# Patient Record
Sex: Male | Born: 1965 | Race: White | Hispanic: No | Marital: Married | Smoking: Current every day smoker
Health system: Southern US, Community
[De-identification: ages and names within clinical notes are randomized; demographics above are authoritative.]

## PROBLEM LIST (undated history)

## (undated) DIAGNOSIS — G4730 Sleep apnea, unspecified: Secondary | ICD-10-CM

## (undated) DIAGNOSIS — T8859XA Other complications of anesthesia, initial encounter: Secondary | ICD-10-CM

## (undated) DIAGNOSIS — M1990 Unspecified osteoarthritis, unspecified site: Secondary | ICD-10-CM

## (undated) DIAGNOSIS — I82609 Acute embolism and thrombosis of unspecified veins of unspecified upper extremity: Secondary | ICD-10-CM

## (undated) DIAGNOSIS — I2510 Atherosclerotic heart disease of native coronary artery without angina pectoris: Secondary | ICD-10-CM

## (undated) DIAGNOSIS — K429 Umbilical hernia without obstruction or gangrene: Secondary | ICD-10-CM

## (undated) DIAGNOSIS — I809 Phlebitis and thrombophlebitis of unspecified site: Secondary | ICD-10-CM

## (undated) DIAGNOSIS — F4310 Post-traumatic stress disorder, unspecified: Secondary | ICD-10-CM

## (undated) DIAGNOSIS — K760 Fatty (change of) liver, not elsewhere classified: Secondary | ICD-10-CM

## (undated) DIAGNOSIS — K219 Gastro-esophageal reflux disease without esophagitis: Secondary | ICD-10-CM

## (undated) HISTORY — DX: Unspecified osteoarthritis, unspecified site: M19.90

## (undated) HISTORY — PX: TONSILLECTOMY: SUR1361

## (undated) HISTORY — DX: Phlebitis and thrombophlebitis of unspecified site: I80.9

## (undated) HISTORY — DX: Sleep apnea, unspecified: G47.30

## (undated) HISTORY — DX: Gastro-esophageal reflux disease without esophagitis: K21.9

## (undated) HISTORY — PX: NASAL SINUS SURGERY: SHX719

## (undated) HISTORY — PX: UPPER GI ENDOSCOPY: SHX6162

## (undated) HISTORY — DX: Umbilical hernia without obstruction or gangrene: K42.9

---

## 2003-05-11 HISTORY — PX: CERVICAL DISCECTOMY: SHX98

## 2004-07-05 ENCOUNTER — Ambulatory Visit (HOSPITAL_BASED_OUTPATIENT_CLINIC_OR_DEPARTMENT_OTHER): Admission: RE | Admit: 2004-07-05 | Discharge: 2004-07-05 | Payer: Self-pay | Admitting: Otolaryngology

## 2004-07-05 ENCOUNTER — Ambulatory Visit: Payer: Self-pay | Admitting: Internal Medicine

## 2004-08-10 ENCOUNTER — Ambulatory Visit (HOSPITAL_COMMUNITY): Admission: RE | Admit: 2004-08-10 | Discharge: 2004-08-11 | Payer: Self-pay | Admitting: Otolaryngology

## 2004-08-10 ENCOUNTER — Encounter (INDEPENDENT_AMBULATORY_CARE_PROVIDER_SITE_OTHER): Payer: Self-pay | Admitting: Specialist

## 2004-11-27 ENCOUNTER — Ambulatory Visit (HOSPITAL_COMMUNITY): Admission: RE | Admit: 2004-11-27 | Discharge: 2004-11-27 | Payer: Self-pay | Admitting: Family Medicine

## 2005-02-26 ENCOUNTER — Ambulatory Visit (HOSPITAL_COMMUNITY): Admission: RE | Admit: 2005-02-26 | Discharge: 2005-02-26 | Payer: Self-pay | Admitting: Neurosurgery

## 2005-04-20 ENCOUNTER — Encounter: Admission: RE | Admit: 2005-04-20 | Discharge: 2005-04-20 | Payer: Self-pay | Admitting: Neurosurgery

## 2010-05-22 ENCOUNTER — Ambulatory Visit
Admission: RE | Admit: 2010-05-22 | Discharge: 2010-05-22 | Payer: Self-pay | Source: Home / Self Care | Attending: Family Medicine | Admitting: Family Medicine

## 2010-05-22 DIAGNOSIS — M129 Arthropathy, unspecified: Secondary | ICD-10-CM | POA: Insufficient documentation

## 2010-05-22 DIAGNOSIS — Z9189 Other specified personal risk factors, not elsewhere classified: Secondary | ICD-10-CM | POA: Insufficient documentation

## 2010-05-22 DIAGNOSIS — F1011 Alcohol abuse, in remission: Secondary | ICD-10-CM | POA: Insufficient documentation

## 2010-05-22 DIAGNOSIS — M722 Plantar fascial fibromatosis: Secondary | ICD-10-CM | POA: Insufficient documentation

## 2010-05-22 DIAGNOSIS — G4733 Obstructive sleep apnea (adult) (pediatric): Secondary | ICD-10-CM | POA: Insufficient documentation

## 2010-05-22 DIAGNOSIS — R1013 Epigastric pain: Secondary | ICD-10-CM | POA: Insufficient documentation

## 2010-05-28 ENCOUNTER — Encounter: Payer: Self-pay | Admitting: Family Medicine

## 2010-05-28 ENCOUNTER — Ambulatory Visit
Admission: RE | Admit: 2010-05-28 | Discharge: 2010-05-28 | Payer: Self-pay | Source: Home / Self Care | Attending: Family Medicine | Admitting: Family Medicine

## 2010-05-28 DIAGNOSIS — E786 Lipoprotein deficiency: Secondary | ICD-10-CM | POA: Insufficient documentation

## 2010-05-29 DIAGNOSIS — R7309 Other abnormal glucose: Secondary | ICD-10-CM | POA: Insufficient documentation

## 2010-05-29 LAB — CONVERTED CEMR LAB
ALT: 31 units/L (ref 0–53)
AST: 17 units/L (ref 0–37)
Albumin: 4.7 g/dL (ref 3.5–5.2)
Alkaline Phosphatase: 57 units/L (ref 39–117)
BUN: 7 mg/dL (ref 6–23)
CO2: 28 meq/L (ref 19–32)
Calcium: 9.2 mg/dL (ref 8.4–10.5)
Chloride: 101 meq/L (ref 96–112)
Cholesterol: 123 mg/dL (ref 0–200)
Creatinine, Ser: 1.05 mg/dL (ref 0.40–1.50)
Glucose, Bld: 118 mg/dL — ABNORMAL HIGH (ref 70–99)
HDL: 24 mg/dL — ABNORMAL LOW (ref >39)
LDL Cholesterol: 70 mg/dL (ref 0–99)
Lipase: 13 units/L (ref 0–75)
Potassium: 4.3 meq/L (ref 3.5–5.3)
Sodium: 140 meq/L (ref 135–145)
TSH: 1.533 microintl units/mL (ref 0.350–4.500)
Total Bilirubin: 0.7 mg/dL (ref 0.3–1.2)
Total CHOL/HDL Ratio: 5.1
Total Protein: 6.5 g/dL (ref 6.0–8.3)
Triglycerides: 144 mg/dL (ref <150)
VLDL: 29 mg/dL (ref 0–40)

## 2010-05-30 ENCOUNTER — Encounter: Payer: Self-pay | Admitting: Family Medicine

## 2010-06-03 ENCOUNTER — Encounter: Payer: Self-pay | Admitting: Internal Medicine

## 2010-06-03 ENCOUNTER — Ambulatory Visit
Admission: RE | Admit: 2010-06-03 | Discharge: 2010-06-03 | Payer: Self-pay | Source: Home / Self Care | Attending: Internal Medicine | Admitting: Internal Medicine

## 2010-06-05 ENCOUNTER — Other Ambulatory Visit: Payer: Self-pay | Admitting: Internal Medicine

## 2010-06-05 ENCOUNTER — Ambulatory Visit
Admission: RE | Admit: 2010-06-05 | Discharge: 2010-06-05 | Payer: Self-pay | Source: Home / Self Care | Attending: Internal Medicine | Admitting: Internal Medicine

## 2010-06-05 ENCOUNTER — Telehealth: Payer: Self-pay | Admitting: Internal Medicine

## 2010-06-05 DIAGNOSIS — K2270 Barrett's esophagus without dysplasia: Secondary | ICD-10-CM

## 2010-06-08 ENCOUNTER — Telehealth: Payer: Self-pay | Admitting: Internal Medicine

## 2010-06-10 ENCOUNTER — Encounter: Payer: Self-pay | Admitting: Internal Medicine

## 2010-06-11 NOTE — Progress Notes (Signed)
Summary: ? re meds  Phone Note Call from Patient Call back at Home Phone [phone] Call back at Work Phone [phone] Call back at cell [phone]   Caller: SpouseJennifer Call For: Dr Juanda Chance Reason for Call: Talk to Nurse Summary of Call: Patient has questions regarding medications given to him today after procedure. Initial call taken by: Tawni Levy,  June 05, 2010 4:20 PM  Follow-up for Phone Call        Spoke with wife and explained how to take all of the meds.   She wrote them all down again "in her own words", and she said that she would try to get  "him" the patient to take them.  She also has the nexium card to help with expenses.  I spoke with this couple extensively in recovery.  Wife understands the schedule now, and will call if there are any more questions. Follow-up by: Clide Cliff RN,  June 05, 2010 5:26 PM

## 2010-06-11 NOTE — Procedures (Addendum)
Summary: Upper Endoscopy  Patient: Kenneth Mckee Note: All result statuses are Final unless otherwise noted.  Tests: (1) Upper Endoscopy (EGD)   EGD Upper Endoscopy       DONE     [facility]     [address]     [place], [place]  [address]           ENDOSCOPY PROCEDURE REPORT           PATIENT:  [name], [name]  MR#:  [id number]     BIRTHDATE:  04-22-1966, 44 yrs. old  GENDER:  male           ENDOSCOPIST:  Hedwig Morton. Juanda Chance, MD     Referred by:  Excell Seltzer, M.D.           PROCEDURE DATE:  06/05/2010     PROCEDURE:  EGD with biopsy, [id number]     ASA CLASS:  Class II     INDICATIONS:  abdominal pain several months of epig.pain, while     taking Motrin 800mg , 2-3x/day, refractory to prelosec, improved on     nexiem           MEDICATIONS:   Versed 5 mg, Fentanyl 50 mcg     TOPICAL ANESTHETIC:  Exactacain Spray           DESCRIPTION OF PROCEDURE:   After the risks benefits and     alternatives of the procedure were thoroughly explained, informed     consent was obtained.  The LB GIF-H180 [id number] endoscope was     introduced through the mouth and advanced to the second portion of     the duodenum, without limitations.  The instrument was slowly     withdrawn as the mucosa was fully examined.     <<PROCEDUREIMAGES>>           ULTRASONIC FINDINGS:  Esophagitis was found. long tongue of     gastric mucosa.  cm, With standard forceps, a biopsy was obtained     and sent to pathology (see image1, image9, image8, and image7).     r/o barrett's A hiatal hernia was found (see image2). 2 cm     redjucible hiatal hernia  Retained food was present (see image3).     100cc of retained fluid, no food  Otherwise the examination was     normal. With standard forceps, a biopsy was obtained and sent to     pathology. biopsy gastric antrum to r/o H (see image4, image5, and     image6).pylori    Retroflexed views revealed no abnormalities.     The scope was then withdrawn from the patient and  the procedure     completed.           COMPLICATIONS:  None           ENDOSCOPIC IMPRESSION:     1) Esophagitis     2) Hiatal hernia     3) Food, retained     4) Otherwise normal examination     RECOMMENDATIONS:     1) Await biopsy results     2) Anti-reflux regimen to be follow     Nexiem 40 mg po bid if financially possible, otherwise Prelosec     40mg  po bid #60,and Pepcid 40 mg qd, # 30 1 po qd x 1 year refill                 REPEAT EXAM:  In 0 year(s)  for.           ______________________________     Hedwig Morton. Juanda Chance, MD           CC:           n.     eSIGNED:   Hedwig Morton. Brodie at 06/05/2010 08:31 AM           Page 2 of 3   Koury, Roddy Ellsworth, [id number]  Note: An exclamation mark (!) indicates a result that was not dispersed into the flowsheet. Document Creation Date: 06/05/2010 8:32 AM _______________________________________________________________________  (1) Order result status: Final Collection or observation date-time: 06/05/2010 08:18 Requested date-time:  Receipt date-time:  Reported date-time:  Referring Physician:   Ordering Physician: Lina Sar [id number]) Specimen Source:  Source: Launa Grill Order Number: [id number] Lab site:   Appended Document: Upper Endoscopy     Procedures Next Due Date:    EGD: 06/2012

## 2010-06-11 NOTE — Miscellaneous (Signed)
Summary: Med  Clinical Lists Changes  Medications: Added new medication of DICLOFENAC SODIUM 75 MG TBEC (DICLOFENAC SODIUM) one tablet twice  daily - Signed Rx of DICLOFENAC SODIUM 75 MG TBEC (DICLOFENAC SODIUM) one tablet twice  daily;  #60 x 0;  Signed;  Entered by: Clide Cliff RN;  Authorized by: Hart Carwin MD;  Method used: [facility]  [address] [phone]*, [address], [place], [place], [place]  [address], Ph: [phone] or [phone], Fax: [phone]    Prescriptions: DICLOFENAC SODIUM 75 MG TBEC (DICLOFENAC SODIUM) one tablet twice  daily  #60 x 0   Entered by:   Clide Cliff RN   Authorized by:   Hart Carwin MD   Signed by:   Clide Cliff RN on 06/05/2010   Method used:   [facility]        [facility]  [address] [phone]* (retail)       [address]       [place], [place]  [address]       Ph: [phone] or [phone]       Fax: [phone]   RxID:   [id number]

## 2010-06-11 NOTE — Assessment & Plan Note (Signed)
Summary: NEW PT TO EST/CLE   Vital Signs:  Patient profile:   45 year old male Height:      72.5 inches Weight:      259.75 pounds BMI:     34.87 Temp:     98.7 degrees F oral Pulse rate:   76 / minute Pulse rhythm:   regular BP sitting:   120 / 70  (left arm) Cuff size:   large  Vitals Entered By: Benny Lennert CMA Duncan Dull) (May 22, 2010 2:04 PM)  History of Present Illness: Chief complaint new patient to be established   In last year.. saw MD out of network...Dr. Elwyn Reach in [facility] [place].   GERD, epiagstric pain.Marland Kitchen on nexium 6 months ago ..this mproved symtpoms. Tried to change to omeprazole but symtpoms came back.  Even now since back on nexium x serercal months.. has remained tender in epigastrum  No current heartburn, no sour taste in throat. No blood in stool.  Last CPX over 10 years ago.   Seeing Dr. Delford Field for podiatry for plantar fasciitis... 75 % improved.  Severe sleep apnea.. Had multiple ENT surgeries...nonwe helped still stops sleeping at night, still snoring loud.  AM fatigue.  No headache.   Preventive Screening-Counseling & Management  Alcohol-Tobacco     Smoking Status: current  Caffeine-Diet-Exercise     Does Patient Exercise: no      Drug Use:  no.    Problems Prior to Update: None  Current Medications (verified): 1)  Nexium 40 Mg Cpdr (Esomeprazole Magnesium) .... One Tablet Daily  Allergies (verified): 1)  ! Penicillin  Past History:  Past medical, surgical, family and social histories (including risk factors) reviewed, and no changes noted (except as noted below).  Past Medical History: Current Problems:  CHICKENPOX, HX OF (ICD-V15.9) ARTHRITIS (ICD-716.90)  Phlebitis in right wrist artery...so small  ARTERYand so far down.. no surgery indicated.. was on coumadin 6 months in 2004 Sleep study: 2004 to 2006? severe sleep apnea  Past Surgical History:  discectomy cervical 2005  Family History: Reviewed history and no  changes required. mother: healthy father: MI age 29 no siblings PGF: leukemia  Social History: Reviewed history and no changes required. 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 CV:  Denies chest pain or discomfort. Resp:  Denies shortness of breath. GI:  Complains of abdominal pain and constipation; denies bloody stools, diarrhea, gas, nausea, vomiting, and vomiting blood; straining more than he used to in past.. still regualr BMs. GU:  Denies dysuria. Psych:  Denies anxiety and depression.  Physical Exam  General:  Well-developed,well-nourished,in no acute distress; alert,appropriate and cooperative throughout examination Ears:  External ear exam shows no significant lesions or deformities.  Otoscopic examination reveals clear canals, tympanic membranes are intact bilaterally without bulging, retraction, inflammation or discharge. Hearing is grossly normal bilaterally. Nose:  External nasal examination shows no deformity or inflammation. Nasal mucosa are pink and moist without lesions or exudates. Mouth:  Oral mucosa and oropharynx without lesions or exudates.  Teeth in good repair. Neck:  no carotid bruit or thyromegaly no cervical or supraclavicular lymphadenopathy  Lungs:  Normal respiratory effort, chest expands symmetrically. Lungs are clear to auscultation, no crackles or wheezes. Heart:  Normal rate  and regular rhythm. S1 and S2 normal without gallop, murmur, click, rub or other extra sounds. Abdomen:  ttp in epigastrum, no rebound no guarding, no hepatosplenomegaly. no abdominal hernia and no inguinal hernia.   Pulses:  R and L posterior tibial pulses are  full and equal bilaterally  Extremities:  no edema  Skin:  Intact without suspicious lesions or rashes Psych:  Cognition and judgment appear intact. Alert and cooperative with normal attention span and concentration. No apparent delusions, illusions, hallucinations   Impression & Recommendations:  Problem # 1:  EPIGASTRIC PAIN (ICD-789.06) ? gastritis, PUD vs pancreatitis. Check lipase, Hpylori, LFTS etc. Contonue nexium. MAy need endoscopy.   Obtain records from last MD for review,  Problem # 2:  SLEEP APNEA (ICD-780.57) Referral to [facility] for CPAP set up/ vs redo sleep study.  Orders: Pulmonary Referral (Pulmonary)  Problem # 3:  ALCOHOL ABUSE, IN REMISSION (ICD-305.03)  Complete Medication List: 1)  Nexium 40 Mg Cpdr (Esomeprazole magnesium) .... One tablet daily  Patient Instructions: 1)  Schedule CPX in next few months. 2)  Fasting CMET, Lipids DX V77.91 lipase, TSH, Hpylori stool antigen  Dx 789.06 3)  Referral Appointment Information 4)  Day/Date: 5)  Time: 6)  Place/MD: 7)  Address: 8)  Phone/Fax: 9)  Patient given appointment information. Information/Orders faxed/mailed.    Orders Added: 1)  Pulmonary Referral [Pulmonary] 2)  New Patient Level III [[address]]    Current Allergies (reviewed today): ! PENICILLIN

## 2010-06-11 NOTE — Letter (Signed)
Summary: EGD Instructions  [facility]  [address] [place], [place] [address]   Phone: [phone]  Fax: [phone]       Kenneth Mckee    1966/03/04    MRN: [id number]       Procedure Day /Date: Friday 06/05/10     Arrival Time: 7:30 am     Procedure Time: 8 am     Location of Procedure:                    _ x_ [facility] (4th Floor)  PREPARATION FOR ENDOSCOPY   On 06/05/10 THE DAY OF THE PROCEDURE:  1.   No solid foods, milk or milk products are allowed after midnight the night before your procedure.  2.   Do not drink anything colored red or purple.  Avoid juices with pulp.  No orange juice.  3.  You may drink clear liquids until 6:00 am, which is 2 hours before your procedure.                                                                                                CLEAR LIQUIDS INCLUDE: Water Jello Ice Popsicles Tea (sugar ok, no milk/cream) Powdered fruit flavored drinks Coffee (sugar ok, no milk/cream) Gatorade Juice: apple, white grape, white cranberry  Lemonade Clear bullion, consomm, broth Carbonated beverages (any kind) Strained chicken noodle soup Hard Candy   MEDICATION INSTRUCTIONS  Unless otherwise instructed, you should take regular prescription medications with a small sip of water as early as possible the morning of your procedure.                    OTHER INSTRUCTIONS  You will need a responsible adult at least 44 years of age to accompany you and drive you home.   This person must remain in the waiting room during your procedure.  Wear loose fitting clothing that is easily removed.  Leave jewelry and other valuables at home.  However, you may wish to bring a book to read or an iPod/MP3 player to listen to music as you wait for your procedure to start.  Remove all body piercing jewelry and leave at home.  Total time from sign-in until discharge is approximately 2-3 hours.  You should go  home directly after your procedure and rest.  You can resume normal activities the day after your procedure.  The day of your procedure you should not:   Drive   Make legal decisions   Operate machinery   Drink alcohol   Return to work  You will receive specific instructions about eating, activities and medications before you leave.    The above instructions have been reviewed and explained to me by   _______________________    I fully understand and can verbalize these instructions _____________________________ Date _________

## 2010-06-11 NOTE — Assessment & Plan Note (Signed)
Summary: epigastric pain....em   History of Present Illness Visit Type: Initial Consult Primary GI MD: Lina Sar MD Primary Provider: Kerby Nora, MD Requesting Provider: Kerby Nora, MD Chief Complaint: epigastirc pain, tender to touch x 6 months; some relief with Nexium History of Present Illness:   This is a 45 year old white male with epigastric pain of several months duration exacerbated by taking large doses of Motrin approximately 800 mg 2-3 times a day. He takes Motrin for teeth pain and for polyarthralgias.  He has no significant GI history. He does not drink alcohol currently but does have a history of being a heavy drinker. He has obstructive sleep apnea and he continues to smoke. He was on Nexium in the past which seemed to have improved the pain but is back on Prilosec 20 mg once a day. He really does not think this helps.   GI Review of Systems    Reports abdominal pain, acid reflux, and  heartburn.     Location of  Abdominal pain: epigastric area.    Denies belching, bloating, chest pain, dysphagia with liquids, dysphagia with solids, loss of appetite, nausea, vomiting, vomiting blood, weight loss, and  weight gain.      Reports constipation.     Denies anal fissure, black tarry stools, change in bowel habit, diarrhea, diverticulosis, fecal incontinence, heme positive stool, hemorrhoids, irritable bowel syndrome, jaundice, light color stool, liver problems, rectal bleeding, and  rectal pain.    Current Medications (verified): 1)  Nexium 40 Mg Cpdr (Esomeprazole Magnesium) .... One Tablet Daily  Allergies (verified): 1)  ! Penicillin  Past History:  Past Medical History: Reviewed history from 05/22/2010 and no changes required. Current Problems:  CHICKENPOX, HX OF (ICD-V15.9) ARTHRITIS (ICD-716.90)  Phlebitis in right wrist artery...so small  ARTERYand so far down.. no surgery indicated.. was on coumadin 6 months in 2004 Sleep study: 2004 to 2006? severe sleep  apnea  Past Surgical History:  discectomy cervical 2005 Tonsillectomy Sinus surgery  Family History: Reviewed history from 05/22/2010 and no changes required. mother: healthy father: MI age 68 no siblings PGF: leukemia  Social History: Reviewed history from 05/22/2010 and no changes required. 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 Current Smoker:  25 pack years.Marland KitchenMarland Kitchenprecontemplative Alcohol use-no, heavy drinking remotely...sober in last 6 years Drug use-no, remote marijuana use Regular exercise-no  Review of Systems  The patient denies allergy/sinus, anemia, anxiety-new, arthritis/joint pain, back pain, blood in urine, breast changes/lumps, change in vision, confusion, cough, coughing up blood, depression-new, fainting, fatigue, fever, headaches-new, hearing problems, heart murmur, heart rhythm changes, itching, menstrual pain, muscle pains/cramps, night sweats, nosebleeds, pregnancy symptoms, shortness of breath, skin rash, sleeping problems, sore throat, swelling of feet/legs, swollen lymph glands, thirst - excessive , urination - excessive , urination changes/pain, urine leakage, vision changes, and voice change.         .  Vital Signs:  Patient profile:   45 year old male Height:      74 inches Weight:      251 pounds BMI:     32.34 Pulse rate:   84 / minute Pulse rhythm:   regular BP sitting:   128 / 80  (left arm) Cuff size:   large  Vitals Entered By: June McMurray CMA Duncan Dull) (June 03, 2010 10:51 AM)  Physical Exam  General:  Well developed, well nourished, no acute distress. Mouth:  No deformity or lesions, dentition normal. Neck:  Supple; no masses or thyromegaly. Lungs:  Clear throughout to auscultation. Heart:  Regular rate and rhythm; no murmurs, rubs,  or bruits. Abdomen:  soft lax abdomen mildly obese. Very tender across the upper abdomen mostly in the epigastrium in the center. Bowel sounds are normoactive. There is no CVA tenderness. Lower abdomen is  unremarkable. Extremities:  No clubbing, cyanosis, edema or deformities noted. Skin:  Intact without significant lesions or rashes. Psych:  Alert and cooperative. Normal mood and affect.   Impression & Recommendations:  Problem # 1:  EPIGASTRIC PAIN (ICD-789.06)  Patient has epigastric and mid abdominal pain likely related to NSAIDs;specifically Motrin which patient takes in large doses. I suspect either erosive gastritis or even an ulcer. We need to rule out H. pyloric gastropathy. I doubt that this is a biliary problem or pancreatitis. We will proceed with an upper endoscopy. I have discussed at length with him stopping his Motrin and we will start him on Celebrex 200 mg once or twice a day. He will also switch back to Nexium 40 mg twice a day.  Orders: EGD (EGD)  Problem # 2:  SLEEP APNEA (ICD-780.57) Assessment: Comment Only  Patient Instructions: 1)  You have been scheduled for an endoscopy. Please follow written prep instructions that were given to you today at your visit.  2)  Please pick up your prescriptions at the pharmacy. Electronic prescription(s) has already been sent for Nexium 40 mg by mouth two times a day and Celebrex 200 mg 1 by mouth two times a day. 3)  Copy sent to : Kerby Nora, MD 4)  The medication list was reviewed and reconciled.  All changed / newly prescribed medications were explained.  A complete medication list was provided to the patient / caregiver. Prescriptions: CELEBREX 200 MG CAPS (CELECOXIB) Take 1 tablet by mouth two times a day  #60 x 3   Entered by:   Lamona Curl CMA (AAMA)   Authorized by:   Hart Carwin MD   Signed by:   Lamona Curl CMA (AAMA) on 06/03/2010   Method used:   [facility]  [address] [phone]* (retail)       [address]       [place], [place]  [address]       Ph: [phone] or [phone]       Fax: [phone]   RxID:   [id number] NEXIUM 40 MG CPDR  (ESOMEPRAZOLE MAGNESIUM) Take 1 tablet by mouth two times a day  #60 x 3   Entered by:   Lamona Curl CMA (AAMA)   Authorized by:   Hart Carwin MD   Signed by:   Lamona Curl CMA (AAMA) on 06/03/2010   Method used:   [facility]        [facility]  [address] [phone]* (retail)       [address]       [place], [place]  [address]       Ph: [phone] or [phone]       Fax: [phone]   RxID:   [id number]

## 2010-06-16 ENCOUNTER — Encounter: Payer: Self-pay | Admitting: Internal Medicine

## 2010-06-17 ENCOUNTER — Encounter: Payer: Self-pay | Admitting: Internal Medicine

## 2010-06-17 NOTE — Progress Notes (Signed)
Summary: Medication  Phone Note Call from Patient Call back at Home Phone [phone]   Caller: Mervin Hack Call For: Dr. Juanda Chance Reason for Call: Talk to Nurse Summary of Call: Ins. will only cover 1 Nexium a day....[address]Marland Kitchen...wants to discuss before prescribing something else Initial call taken by: Karna Christmas,  June 08, 2010 3:53 PM  Follow-up for Phone Call        Patient's wife advised that [facility] needs to send [place] a prior authorization request form for Nexium so we can call the appropriate number to get the Nexium approved. She verbalizes understanding. Follow-up by: Lamona Curl CMA (AAMA),  June 08, 2010 4:01 PM     Appended Document: Medication Biopsies show Barrett's esophagus. May be his insurance will cover two times a day Nexium   based on this diagnosis  Appended Document: Medication prior authorization is in process at this time.

## 2010-06-23 ENCOUNTER — Institutional Professional Consult (permissible substitution) (INDEPENDENT_AMBULATORY_CARE_PROVIDER_SITE_OTHER): Payer: PRIVATE HEALTH INSURANCE | Admitting: Pulmonary Disease

## 2010-06-23 ENCOUNTER — Encounter: Payer: Self-pay | Admitting: Pulmonary Disease

## 2010-06-23 DIAGNOSIS — G4730 Sleep apnea, unspecified: Secondary | ICD-10-CM

## 2010-06-23 DIAGNOSIS — E669 Obesity, unspecified: Secondary | ICD-10-CM | POA: Insufficient documentation

## 2010-06-25 ENCOUNTER — Encounter: Payer: Self-pay | Admitting: Pulmonary Disease

## 2010-06-25 NOTE — Medication Information (Signed)
Summary: Nexium / [facility]  [facility]   Imported By: Lennie Odor 06/18/2010 15:19:08  _____________________________________________________________________  External Attachment:    Type:   Image     Comment:   External Document

## 2010-06-25 NOTE — Letter (Addendum)
Summary: Patient Notice-Barrett's [facility]  [address] [place], [place] [address]   Phone: [phone]  Fax: [phone]        June 17, 2010 MRN: [id number]    [facility] [address] [place], [place]  [address]    Dear Kenneth Mckee,  I am pleased to inform you that the biopsies taken during your recent endoscopic examination did not show any evidence of cancer upon pathologic examination.  However, your biopsies indicate you have a condition known as Barrett's esophagus. While not cancer, it is pre-cancerous (can progress to cancer) and needs to be monitored with repeat endoscopic examination and biopsies.  Fortunately, it is quite rare that this develops into cancer, but careful monitoring of the condition along with taking your medication as prescribed is important in reducing the risk of developing cancer.  It is my recommendation that you have a repeat upper gastrointestinal endoscopic examination in 2_ years.  Additional information/recommendations:  __Please call [phone] to schedule a return visit to further      evaluate your condition.  _x_Continue with treatment plan as outlined the day of your exam.  Please call us if you have or develop heartburn, reflux symptoms, any swallowing problems, or if you have questions about your condition that have not been fully answered at this time.  Sincerely,  Kenneth Carwin MD  This letter has been electronically signed by your physician.  Appended Document: Patient Notice-Barrett's Esopghagus Letter Mailed

## 2010-06-25 NOTE — Medication Information (Signed)
Summary: Approval/[facility]  Approval/[facility]   Imported By: Lester Kirk 06/19/2010 14:25:12  _____________________________________________________________________  External Attachment:    Type:   Image     Comment:   External Document

## 2010-06-26 ENCOUNTER — Telehealth: Payer: Self-pay | Admitting: Pulmonary Disease

## 2010-06-29 ENCOUNTER — Encounter: Payer: Self-pay | Admitting: Pulmonary Disease

## 2010-07-01 NOTE — Progress Notes (Signed)
Summary: cpap issues  Phone Note Call from Patient Call back at Home Phone [phone] Call back at [phone]   Caller: Patient Call For: Kenneth Mckee Reason for Call: Talk to Nurse Summary of Call: Patient calling saying he picked up cpap yesterday.  He used cpap last night and "it made his stomach blow up like a basketball".  He saying he can not wear mask. Initial call taken by: Lehman Prom,  June 26, 2010 9:51 AM  Follow-up for Phone Call        lmomtcb on both cell and home numbers Randell Loop [facility]  June 26, 2010 1:36 PM    pt returned my call ---he stated that he used his cpap last night for the first time and stated that his belly was filled with air this am--he called the rep that he got the cpap from and he instructed him to call his doctor.  cpap is set on auto.  please advise thanks Randell Loop [facility]  June 26, 2010 1:56 PM   Additional Follow-up for Phone Call Additional follow up Details #1::        On autoCPAP 8-18 Change to fixed pressure 14 cm & try x 2 nights If persists, have him call back will chnage to BiPAP Additional Follow-up by: Comer Locket. Vassie Loll MD,  June 26, 2010 2:34 PM    Additional Follow-up for Phone Call Additional follow up Details #2::    called and spoke with pt and he is aware of cpap changes per Dr. Pearson Forster will call back if this does not work on 14---he is aware that order has been sent to DME to change settings.   Randell Loop [facility]  June 26, 2010 3:40 PM

## 2010-07-01 NOTE — Assessment & Plan Note (Signed)
Summary: sleep problems- [facility] office- spouse given directions//kp   Visit Type:  Initial Consult/sleep Copy to:  dr Ermalene Searing Primary Provider/Referring Provider:  Kerby Nora, MD  CC:  sleep consult/ ref by Dr Ermalene Searing. pt states had endoscopy in Jan and oxygen dropped very low. sleep study done 2006.  History of Present Illness: 44/M, smoker  for evaluation of obstructive sleep apnea  Underwent EGD by dr Juanda Chance - o2 level dropped  Underwent PSG  in feb 2006, AHI 88/h with nadir desatn of 70%,  developed nasal congestion with CPAP, hence biPPA titrated to 14/6  with residual RDI of 57/h. he subsequently underwent UPPP by Dr Ezzard Standing, but symptoms never resolved. Now asked by wife to seek attention, married x 3 y with a 3 y.o. keeps cold water in truck, c/o excessive daytime somnolence , wife of 3 yrs  reports loud snoring, does not sleep in the same room Bedtime 10-11 pm, falling asleep while watching TV prior to bedime, latency 5 mins, 1-2 awakenings, nocturia +, oob 0630 , tired, dry mouth , 4 cups sweet tea Epworth Sleepiness Score 20/24 There is no history suggestive of cataplexy, sleep paralysis or parasomnias   Preventive Screening-Counseling & Management  Alcohol-Tobacco     Alcohol drinks/day: 0     Smoking Status: current     Packs/Day: 1.0     Year Started: 1985  Caffeine-Diet-Exercise     Caffeine use/day: sweet tea 1/2 gal  Allergies: 1)  ! Penicillin  Past History:  Past Medical History: Last updated: 05/22/2010 Current Problems:  CHICKENPOX, HX OF (ICD-V15.9) ARTHRITIS (ICD-716.90)  Phlebitis in right wrist artery...so small  ARTERYand so far down.. no surgery indicated.. was on coumadin 6 months in 2004 Sleep study: 2004 to 2006? 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  The patient denies mask leak, skin irritation, pressure sores or abrasions, dry eyes, dry nose and/or throat, dry mouth, nasal congestion, epistaxis, sinus drainage, rhinorrhea, cough, chest discomfort, aerophagia, difficulty exhaling, CPAP unit is too noisy, difficulty falling asleep, difficulty staying asleep, pulling mask off in the middle of the night, waking up choking, frequent night time awakenings, prolonged middle of the night awakening, early morning awakenings, frequent episodes of sudden loss of muscle tone, difficulty waking, non restorative sleep, shortness of breath, nighttime chest pain, waking up gasping for air, nighttime cough, sleep walking, night terrors, eating during sleep, restless legs syndrome, frequent leg jerks during sleep, persistent bed wetting, acting out dreams, and recurrent nightmares.    Vital Signs:  Patient profile:   45 year old male Height:      74 inches Weight:      260 pounds BMI:     33.50 O2 Sat:      94 % on Room air Temp:     99.0 degrees F oral Pulse rate:   74 / minute BP sitting:   120 / 60  (left  arm) Cuff size:   large  Vitals Entered By: Kandice Hams CMA (June 23, 2010 4:10 PM)  O2 Flow:  Room air CC: sleep consult/ ref by Dr Ermalene Searing. pt states had endoscopy in Jan and oxygen dropped very low. sleep study done 2006   Physical Exam  Additional Exam:  wt 260 June 23, 2010  Gen. Pleasant, well-nourished, in no distress, normal  affect ENT - no lesions, no post nasal drip, class 2 airway, s/p UPPP Neck: No JVD, no thyromegaly, no carotid bruits Lungs: no use of accessory muscles, no dullness to percussion, clear without rales or rhonchi  Cardiovascular: Rhythm regular, heart sounds  normal, no murmurs or gallops, no peripheral edema Abdomen: soft and non-tender, no hepatosplenomegaly, BS normal. Musculoskeletal: No deformities, no cyanosis or clubbing Neuro:  alert, non focal     Impression & Recommendations:  Problem # 1:  SLEEP APNEA (ICD-780.57) Severe obstructive sleep apnea based on PSG in 2006 with excessive daytime somnolence . If anything, he is worse now due to wt gain of 40 lbs since prior study. As such will proceed with auoCPAP 8-120 cm , with nasal mask & humidity The pathophysiology of obstructive sleep apnea, it's cardiovascular consequences and modes of treatment including CPAP were discussed with the patient in great detail.  he will report back in 1 week Arrange FU OV in 2 weeks with download to review. Of note, he had not toelrated CPAP last time during study If significant residuals, may needs biPAP  Orders: Consultation Level IV (04540) DME Referral (DME)  Problem # 2:  OBESITY (ICD-278.00) Hopefully, with decreased somnolence, he can start a diet & exercise program. he seems motivated   Patient Instructions: 1)  Please schedule a follow-up appointment in 2-3  weeks. 2)  We will set you up with a CPAP machine 3)  Trial of nasal spray each nare at bedtime  4)  Call me in 1 week to report 5)  Copy sent to: [facility]

## 2010-07-02 ENCOUNTER — Encounter: Payer: Self-pay | Admitting: Family Medicine

## 2010-07-07 ENCOUNTER — Telehealth: Payer: Self-pay | Admitting: Pulmonary Disease

## 2010-07-09 ENCOUNTER — Encounter: Payer: Self-pay | Admitting: Pulmonary Disease

## 2010-07-09 ENCOUNTER — Ambulatory Visit (INDEPENDENT_AMBULATORY_CARE_PROVIDER_SITE_OTHER): Payer: PRIVATE HEALTH INSURANCE | Admitting: Pulmonary Disease

## 2010-07-09 DIAGNOSIS — G4730 Sleep apnea, unspecified: Secondary | ICD-10-CM

## 2010-07-10 ENCOUNTER — Telehealth: Payer: Self-pay | Admitting: Pulmonary Disease

## 2010-07-13 ENCOUNTER — Telehealth: Payer: Self-pay | Admitting: Pulmonary Disease

## 2010-07-14 ENCOUNTER — Encounter: Payer: Self-pay | Admitting: Family Medicine

## 2010-07-14 DIAGNOSIS — I809 Phlebitis and thrombophlebitis of unspecified site: Secondary | ICD-10-CM | POA: Insufficient documentation

## 2010-07-14 DIAGNOSIS — G4730 Sleep apnea, unspecified: Secondary | ICD-10-CM | POA: Insufficient documentation

## 2010-07-14 DIAGNOSIS — M1990 Unspecified osteoarthritis, unspecified site: Secondary | ICD-10-CM | POA: Insufficient documentation

## 2010-07-14 DIAGNOSIS — B019 Varicella without complication: Secondary | ICD-10-CM | POA: Insufficient documentation

## 2010-07-15 ENCOUNTER — Telehealth: Payer: Self-pay | Admitting: Pulmonary Disease

## 2010-07-16 NOTE — Progress Notes (Signed)
Summary: cpap ? - Requesting call back TODAY  Phone Note Call from Patient Call back at [phone]   Caller: PATIENT WIFE Call For: Helder Crisafulli Summary of Call: THE CPAP IS NOT WORKING FOR HIM.  [facility] TOLD THE PATIENT HE WOULD PROBABLY DO BETTER WITH OXYGEN BUT THEY ARE WAITING TO HEAR FROM [facility].  PLEASE ADVISE  Initial call taken by: Roselle Locus,  July 07, 2010 8:29 AM  Follow-up for Phone Call        Called, spoke with pt's wife.  States cpap set at 14cm did not work.  States they were told by [facility] to call and see if pt was candidate for o2 because cpap does not seem to be working for him but wife requesting I call [facility] to find out exactly what it is they were trying to say bc she is unsure of this.  Called, spoke with Darilyn at [facility].  states on Feb 20 RT seen pt for mask refit and pressure change.  The note states pt could not tolerate pressure of 14 because pt "immediately had air go into his belly."   This report was supposed to have already been sent to Dr. Vassie Loll.  Dr. Vassie Loll, pls advise on next step for pt.  Thanks!  ** Wife requesting call back today.** Follow-up by: Gweneth Dimitri RN,  July 07, 2010 1:49 PM  Additional Follow-up for Phone Call Additional follow up Details #1::        arrange OV to discuss options - other options are lower pressure, or BiPAP can double book on thu 4 pm Additional Follow-up by: Comer Locket. Vassie Loll MD,  July 07, 2010 2:37 PM    Additional Follow-up for Phone Call Additional follow up Details #2::    Spoke with pt's spouse and advised RA wants pt to come in for ov to discuss these issues.  She states that he is unable to come in on thurs, but can come anytime on Friday 3/2- RA's sched is full this day, can we overbook? Pls advise thanks Follow-up by: Vernie Murders,  July 07, 2010 3:33 PM  Additional Follow-up for Phone Call Additional follow up Details #3:: Details for Additional Follow-up Action Taken: OK , prefer thusday    Expressed to wife RA prefers to have pt come in Thursday @ 4pm, pt agrees with same and will come in to be seen at that time. Zackery Barefoot CMA  July 07, 2010 4:33 PM  Additional Follow-up by: Comer Locket. Vassie Loll MD,  July 07, 2010 3:38 PM

## 2010-07-16 NOTE — Assessment & Plan Note (Signed)
Summary: ok to double per RA//jwr   Copy to:  dr Ermalene Searing Primary Provider/Referring Provider:  Kerby Nora, MD  CC:  Pt states not using CPAP due to pressure, mask fit, and air going into stomach causing pt to become bloated.  History of Present Illness: 44/M, smoker  for evaluation of severe obstructive sleep apnea  Underwent EGD by dr Juanda Chance - o2 level dropped  Underwent PSG  in feb 2006, AHI 88/h with nadir desatn of 70%,  developed nasal congestion with CPAP, hence biPPA titrated to 14/6  with residual RDI of 57/h. he subsequently underwent UPPP by Dr Ezzard Standing, but symptoms never resolved. Now asked by wife to seek attention, married x 3 y with a 3 y.o. keeps cold water in truck, c/o excessive daytime somnolence , wife of 3 yrs  reports loud snoring, does not sleep in the same room Bedtime 10-11 pm, falling asleep while watching TV prior to bedime, latency 5 mins, 1-2 awakenings, nocturia +, oob 0630 , tired, dry mouth , 4 cups sweet tea Epworth Sleepiness Score 20/24  July 09, 2010  pt could not tolerate pressure of 14 because pt "immediately had air go into his belly." He also has post traumatic stress & a fullf ace mask makes him very anxious. He is very frustrated - wediscussed various options  There is no history suggestive of cataplexy, sleep paralysis or parasomnias   Preventive Screening-Counseling & Management  Alcohol-Tobacco     Alcohol drinks/day: 0     Smoking Status: current     Packs/Day: 1.0     Year Started: 1985  Current Medications (verified): 1)  Nexium 40 Mg Cpdr (Esomeprazole Magnesium) .... Take 1 Tablet By Mouth Two Times A Day 2)  Celebrex 200 Mg Caps (Celecoxib) .... Take 1 Tablet By Mouth Two Times A Day As Needed  Allergies (verified): 1)  ! Penicillin  Past History:  Past Medical History: Last updated: 05/22/2010 Current Problems:  CHICKENPOX, HX OF (ICD-V15.9) ARTHRITIS (ICD-716.90)  Phlebitis in right wrist artery...so small  ARTERYand so  far down.. no surgery indicated.. was on coumadin 6 months in 2004 Sleep study: 2004 to 2006? 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    Vital Signs:  Patient profile:   45 year old male Height:      74 inches Weight:      259.2 pounds BMI:     33.40 O2 Sat:      96 % on Room air Temp:     98.6 degrees F oral Pulse rate:   81 / minute BP sitting:   30 / 74  (left arm) Cuff size:   large  Vitals Entered By: Zackery Barefoot CMA (July 09, 2010 4:09 PM)  O2 Flow:  Room air CC: Pt states not using CPAP due to pressure, mask fit, air going into stomach causing pt to become bloated Comments Medications reviewed with patient Verified contact number and pharmacy with patient Zackery Barefoot CMA  July 09, 2010  4:10 PM    Physical Exam  Additional Exam:  wt 260 June 23, 2010  Gen. Pleasant, well-nourished, in no distress, normal affect ENT - no lesions, no post nasal drip, class 2 airway, s/p UPPP Neck: No JVD, no thyromegaly, no carotid bruits Lungs: no use of accessory muscles, no dullness to percussion, clear without rales or rhonchi  Cardiovascular: Rhythm regular, heart sounds  normal, no murmurs or gallops, no peripheral edema Musculoskeletal: No deformities, no cyanosis or clubbing       Impression & Recommendations:  Problem # 1:  SLEEP APNEA (ICD-780.57)  he was sent over to the sleep lab & desensitized with biPAP 8/5 & resmed swift large size pillows, which he tolerated well. he has not tolerated CPAP due to stomach insufflation - I think BiPAP will work very well. Chin strap can be used if he is a mouth breather He would benefit from an inlab biPAP titration we discussed other options including oral appliance +/- O2 Compliance encouraged, wt loss emphasized, asked to avoid meds with sedative side effects, cautioned against driving when sleepy.   Orders: Est. Patient Level III (81191) DME Referral (DME)  Medications Added to Medication List This Visit: 1)  Celebrex 200 Mg Caps (Celecoxib) .... Take 1 tablet by mouth two times a day as needed  Patient Instructions: 1)  Copy sent to: 2)  Please schedule a follow-up appointment in 3 weeks. 3)  Go over to the sleep lab [phone] for biPAP 8/5  trial & mask fit - nasal pillows  4)  If this does not work, we will have to try dental appliance + oxygen

## 2010-07-16 NOTE — Miscellaneous (Signed)
Summary: Mask Refit/[facility]  Mask Refit/[facility]   Imported By: Sherian Rein 07/09/2010 14:42:01  _____________________________________________________________________  External Attachment:    Type:   Image     Comment:   External Document

## 2010-07-17 ENCOUNTER — Institutional Professional Consult (permissible substitution): Payer: Self-pay | Admitting: Pulmonary Disease

## 2010-07-21 NOTE — Progress Notes (Signed)
  Phone Note Call from Patient   Caller: [email or web address]  Call For: Sheniah Supak Summary of Call: pt switched to bilevel mas week because he was swollowing air and he still is on bilevel Initial call taken by: Oneita Jolly,  July 15, 2010 4:26 PM

## 2010-07-21 NOTE — Progress Notes (Signed)
Summary: bipap pressure  Phone Note Call from Patient Call back at Home Phone [phone]   Caller: Spouse//jennifer Call For: Millie Shorb Summary of Call: Pt feels that the pressure on his bipap machine needs to be lowered because it's blowing too much air and causing his belly to be full. Initial call taken by: Darletta Moll,  July 13, 2010 12:26 PM  Follow-up for Phone Call        Patients wife Victorino Dike called [facility] wanting to know about the CPAP pressure. She can be reached at [phone].Vedia Coffer  July 13, 2010 4:57 PM    Additional Follow-up for Phone Call Additional follow up Details #1::        Spoke with pt's spouse.  She states that pt feels that his belly is still bloated and feels that this is coming from BIPAP blwing out too much air.  He staes would like pressure lowered. Pls advise thanks Additional Follow-up by: Vernie Murders,  July 13, 2010 5:06 PM    Additional Follow-up for Phone Call Additional follow up Details #2::    I called and informed pt's spouse of phone note from 3.2.12. Spouse advised pt spoke with Mardelle Matte at [facility] who suggested he ask Dr. Vassie Loll to lower his settings to 7/4 from 8/5. Pt feels more rested since using BiPAP but is still having a bloate feeling from machine. Wife advised pt wants trial with different setting and if this doesn't work he will be willing to go and have the BiPAP titration at sleep lab. Please advise if okay to send order for 7/4 settings on BiPAP. Pt wants same done today so he can start tonight. thanks. Zackery Barefoot CMA  July 14, 2010 9:38 AM  Patient wife calling back stating that her husband needs to speak to Dr. Vassie Loll today.  He can be reached @ [phone]  [facility]  July 14, 2010 12:33 PM  Spoke with pt and advised that RA is not in the office today but we have forwarded a messageto him to address this issue. Pt states he s very frustrated with the whole process because he states that Dr. Vassie Loll told himif the bipap did  not work all he needed to do was call our office and he would call him right back. I apoligized to the pt but advised that RA will have to be the one to order bipap change and again he is not in office today. Pt continued to get frustrated an increasingly rude over the phone. I advised I will see what I can do and call the pt back.  I then spoke to Zackery Barefoot and advised that the pt was frustrated. She put in a call to RA to make him aware of situation and asked him to call the pt. I will forward message to RA box. Carron Curie CMA  July 14, 2010 2:19 PM   Additional Follow-up for Phone Call Additional follow up Details #3:: Details for Additional Follow-up Action Taken: I spoke to him - pr lowered , he is bloated but feeling much better  Additional Follow-up by: Comer Locket. Vassie Loll MD,  July 14, 2010 2:32 PM

## 2010-07-21 NOTE — Progress Notes (Signed)
  Phone Note Outgoing Call   Summary of Call: Rx for BiPAP has been sent , with donwload cheeck in 1 week. I would like him to go ahead with a BiPAP in lab titration if he is willing & able to use Initial call taken by: Comer Locket. Vassie Loll MD,  July 10, 2010 1:11 PM  Follow-up for Phone Call        Advised spouse of above. Please see phone note from 07/13/10. Zackery Barefoot CMA  July 14, 2010 9:39 AM

## 2010-07-24 ENCOUNTER — Ambulatory Visit: Payer: PRIVATE HEALTH INSURANCE | Admitting: Pulmonary Disease

## 2010-08-04 ENCOUNTER — Encounter: Payer: Self-pay | Admitting: Pulmonary Disease

## 2010-08-07 ENCOUNTER — Ambulatory Visit: Payer: PRIVATE HEALTH INSURANCE | Admitting: Pulmonary Disease

## 2010-08-18 ENCOUNTER — Encounter: Payer: Self-pay | Admitting: Family Medicine

## 2010-08-25 ENCOUNTER — Encounter: Payer: Self-pay | Admitting: Family Medicine

## 2010-08-26 ENCOUNTER — Telehealth: Payer: Self-pay | Admitting: Pulmonary Disease

## 2010-08-26 DIAGNOSIS — G4730 Sleep apnea, unspecified: Secondary | ICD-10-CM

## 2010-08-26 NOTE — Telephone Encounter (Signed)
Let him know that BiPAP 8/5 shows some residual events (17/h ) If he is tolerating better , can increase pr slightly to 10/7, (he will not notice difference) -rechk download in 4 weeks He has to use it > 4h

## 2010-09-01 ENCOUNTER — Encounter: Payer: Self-pay | Admitting: Family Medicine

## 2010-09-02 ENCOUNTER — Encounter: Payer: Self-pay | Admitting: Pulmonary Disease

## 2010-09-04 NOTE — Telephone Encounter (Signed)
lmomtcb x1 

## 2010-09-09 NOTE — Telephone Encounter (Signed)
Pt advised he is currently on 7/4 and is okay with increasing, however, he wants to increase gradually (ex: 8/5, 9/6, then 10/7). Please advise. Thanks

## 2010-09-10 NOTE — Telephone Encounter (Signed)
Ok to increase gradually every week- as he can tolerate

## 2010-09-11 NOTE — Telephone Encounter (Signed)
Spoke with pt and he states unsure how to adjust the pressure and wants [facility] to help with this. Pls advise or send order to [facility] for this thanks

## 2010-09-11 NOTE — Telephone Encounter (Signed)
lmomtcb x1 

## 2010-09-11 NOTE — Telephone Encounter (Signed)
Pl send order to 'increase BiPAP by 1 cm every week to goal 10/7 8/5, 9/6, then 10/7 '- download on 10/7 x 4 weeks

## 2010-09-11 NOTE — Telephone Encounter (Signed)
Order has been placed per RA recs.

## 2010-09-25 NOTE — Op Note (Signed)
NAMELABARON, DIGIROLAMO                 ACCOUNT NO.:  [id number]   MEDICAL RECORD NO.:  [id number]          PATIENT TYPE:  OIB   LOCATION:  3172                         FACILITY:  [facility]   PHYSICIAN:  Kathaleen Maser. Pool, M.D.    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   PROCEDURE:  C6-7 anterior cervical diskectomy and fusion with allograft and  anterior plating.   SURGEON:  Kathaleen Maser. Pool, M.D.   ASSISTANT:  Reinaldo Meeker, M.D.   ANESTHESIA:  General endotracheal.   INDICATIONS:  Mr. Herzberg is a 45 year old male with history of neck and left  upper extremity pain, paresthesias and weakness due to left-sided C7  radiculopathy.  Workup demonstrates evidence of a large left-sided C6-7 disk  herniation with compression of the spinal cord and the left C7 nerve root.  The patient presents now for C6-7 anterior cervical diskectomy and fusion  with allograft and anterior plating in hopes of improving his symptoms.   OPERATIVE NOTE:  The patient was taken to the operating room and placed on  the operating table in supine position.  After an adequate level of  anesthesia was achieved, the patient was positioned supine with his neck  slightly extended and held in place with halter traction.  The patient's  anterior cervical region was prepped and draped sterilely.  A #10 blade was  used to make a linear skin incision overlying the C6-7 level.  This was  carried down sharply to the platysma.  The platysma was then divided  vertically and dissection proceeds along the medial border of the  sternocleidomastoid and carotid sheath.  Trachea and esophagus are mobilized  and retracted toward the left.  The prevertebral fascia is stripped off  the  anterior spinal column.  The longus colli muscle is then elevated  bilaterally using electrocautery.  A deep self-retaining retractor is  placed, intraoperative x-ray is taken, the level was confirmed.  The disk  space was then incised with a 15 blade in rectangular fashion.  A wide disk  space clean-out was then achieved using pituitary rongeurs and forward and  backward-angled Karlin curettes, Kerrison rongeurs and a high-speed drill.  All elements of the disk were removed down to the posterior annulus. The  remaining aspects of the annulus and osteophytes were removed using the high-  speed drill down to the level of the posterior longitudinal ligament.  The  posterior longitudinal ligament was then elevated and resected in a  piecemeal fashion using Kerrison rongeurs.  The underlying thecal sac was  then identified.  A wide central decompression was then performed by  undercutting the bodies of C6 and C7.  A large amount of free disk  herniation was encountered along the way.  Decompression then extended to  the C7 neural foramina bilaterally.  A wide anterior foraminotomy was then  performed along the course of the exiting C7 nerve root bilaterally with  particular attention to the patient's left C7 nerve root.  At this point a  very thorough decompression was then achieved.  There was no evidence of  injury to the thecal sac or nerve roots.  There was no evidence of any  residual compression of the thecal sac or nerve roots.  The wound was then  irrigated with antibiotic solution.  A 6 mm LifeNet allograft wedge was then  impacted into place, recessed approximately 2 mm from the anterior cortical  margin.  A 25 mm Atlantis anterior cervical plate was then placed to the C6  and C7 levels.  This was then attached under fluoroscopic guidance using 13  mm variable-angled screws.  All four screws were given a final tightening  and found to be solidly within bone.  The locking screws  were engaged at  both levels.  Final images revealed good position of the bone grafts and  hardware, proper operative level and normal alignment of the spine.  The  wound was then inspected for hemostasis and found to be good.  The  microscope and retractor system were removed.  Hemostasis was then achieved  with bipolar electrocautery.  The wound was then closed in typical fashion.  Steri-Strips and sterile dressing were applied.  There were no apparent  complications.  The patient tolerated the procedure well, and he returns to  the recovery room postop.           ______________________________  Kathaleen Maser. Pool, M.D.     HAP/MEDQ  D:  02/26/2005  T:  02/26/2005  Job:  [id number]

## 2010-09-25 NOTE — Op Note (Signed)
[name], Mckee                 ACCOUNT NO.:  [id number]   MEDICAL RECORD NO.:  [id number]          PATIENT TYPE:  OIB   LOCATION:  2899                         FACILITY:  [facility]   PHYSICIAN:  Kristine Garbe. Ezzard Standing, M.D.DATE OF BIRTH:  1965/12/12   DATE OF PROCEDURE:  08/10/2004  DATE OF DISCHARGE:                                 OPERATIVE REPORT   PREOPERATIVE DIAGNOSES:  1.  Obstructive sleep apnea.  2.  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   POSTOPERATIVE DIAGNOSES:  1.  Obstructive sleep apnea.  2.  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   OPERATION PERFORMED:  Septoplasty with bilateral inferior turbinate  reductions.  Uvulopalatopharyngoplasty  with tonsillectomy.   SURGEON:  Kristine Garbe. Ezzard Standing, M.D.   ANESTHESIA:  General endotracheal.   COMPLICATIONS:  None.   INDICATIONS FOR PROCEDURE:  Kenneth Mckee is a 45 year old gentleman who has  had history of symptoms of sleep apnea with nasal obstruction and difficulty  breathing at night.  His girlfriend has noted that he stops breathing at  night.  He underwent a sleep test which demonstrated severe obstructive  sleep apnea with an RDI of 80 and oxygen saturations down in the 70s.  He  was unable to tolerate use of nasal CPAP and it was recommended he undergo  correctable upper air way obstruction surgery. He is thus taken to the  operating room at this time for septoplasty, turbinate reductions,  uvulopalatopharyngoplasty with tonsillectomy.   DESCRIPTION OF PROCEDURE:  After adequate endotracheal anesthesia, the  patient received 12 mg of Decadron IV preoperatively as well as 1 gm IV  preoperatively.  The nose was prepped with cotton pledgets soaked in  decongestant and septum and turbinates were injected with Xylocaine with  epinephrine for hemostasis.  A hemitransfixion incision was made along the  left edge of the septum on the inferior aspect.  Mucoperichondrial and  mucoperiosteal  flaps were elevated posteriorly.  A portion of the bony and  cartilaginous septum that protruded into the left side was excised after  elevating mucoperichondrial mucosal flaps on either side removing the  deviated portion.  The base of the septum was pushed back over to midline.  This completed the septoplasty portion of the procedure.  The  hemitransfixion incision was closed with interrupted 4-0 chromic sutures and  the septum was basted with a 4-0 chromic suture.  Following this, inferior  turbinate reductions were performed.  An incision was made along the  inferior edge of the turbinate.  A little bit of the anterior turbinate bone  was removed.  [name] really did have a thick turbinate bone and submucosal  cauterization was performed and then the remaining turbinate tissue was  outfractured.  This completed the turbinate reductions.  The nose was packed  with Telfa soaked in bacitracin ointment.  The patient was then turned and a  mouth gag was used to expose the oropharynx.  The patient had a very long  uvula and soft palate.  Had moderate sized tonsils.  The tonsils were  resected from the tonsillar fossas.  Of note there was a small papilloma  along the inferior aspect of the right tonsil.  After removing the tonsils,  the distal 1 cm of soft palate and uvula was transected and removed.  This  completed the procedure.  Hemostasis was obtained with cautery.  The mucosal  edges of the palate were then reapproximated with 3-0 Vicryl and 4-0 chromic  sutures.  [name] was awakened from anesthesia and transferred to recovery room  postoperatively doing well.   DISPOSITION:  [name] will be observed overnight in a monitored bed.  Will plan  on removing his nasal packing in the morning and discharge home on  erythromycin and Lortab elixir as needed for pain.  Will have him follow up  in my office in two weeks for recheck.      CEN/MEDQ  D:  08/10/2004  T:  08/10/2004  Job:  [id number]

## 2010-09-25 NOTE — Procedures (Signed)
[name], Kenneth Mckee                 ACCOUNT NO.:  [id number]   MEDICAL RECORD NO.:  [id number]          PATIENT TYPE:  OUT   LOCATION:  SLEEP CENTER                 FACILITY:  [facility]   PHYSICIAN:  Clinton D. Maple Hudson, M.D. DATE OF BIRTH:  1966-02-20   DATE OF STUDY:  07/05/2004                              NOCTURNAL POLYSOMNOGRAM   REFERRING PHYSICIAN:  Dillard Cannon, MD   INDICATIONS FOR STUDY:  Hypersomnia with sleep apnea. Epworth sleepiness  score 9/24, BMI 29, weight 220 pounds.   SLEEP ARCHITECTURE:  Total sleep time 334 minutes with sleep efficiency of  78%. Stage I was 11%, stage II was 53%, stages III and IV were 11%, REM was  25% of total sleep time. Latency to sleep onset 13.5 minutes. Latency to REM  89 minutes. Awake after sleep onset 82 minutes. Arousal index increased at  57.   RESPIRATORY DATA:  Split study protocol. Respiratory disturbance index (RDI)  88.3 obstructive events per hour indicating severe obstructive sleep  apnea/hypopnea syndrome before CPAP. This included 193 obstructive apneas  and 14 hypopneas before CPAP. The events were not positional. REM RDI of 46.  The technician initiated CPAP at 5 CWP, but the patient complained of  difficulty breathing out and of nasal congestion. He was switched to BIPAP  which was titrated finally to inspiratory 14/expiratory 6 CWP, RDI of 57 per  hour. The technician was unable to get the patient comfortable with BIPAP  and insufficient control was achieved with these pressures. A small  Respironics Comfort Gel mask was used with heated humidifier.   OXYGEN DATA:  Loud snoring with oxygen desaturation to a nadir of 70%. Mean  oxygen saturation on room air through the study was 88% to 90%.  Significant  desaturations were still occurring on BIPAP when he tried to sleep on his  back.   CARDIAC DATA:  Normal sinus rhythm.   MOVEMENT/PARASOMNIA:  Occasional leg jerk.   IMPRESSION/RECOMMENDATIONS:  1.  Severe  obstructive sleep apnea/hypopnea syndrome, RDI of 88.3 per hour      with oxygen desaturation to a nadir of 70%.  2.  Unsuccessful CPAP/BIPAP titration up to inspiratory 14/expiratory 6 CWP.      The patient was uncomfortable because of nasal congestion. Adequate      control was never achieved.  3.  Suggest evaluation for correctable upper airway obstruction. CPAP      titration could be reconsidered if appropriate.      CDY/MEDQ  D:  07/12/2004 09:08:12  T:  07/12/2004 10:02:55  Job:  [id number]

## 2010-10-01 ENCOUNTER — Encounter: Payer: Self-pay | Admitting: Pulmonary Disease

## 2011-05-11 HISTORY — PX: HERNIA REPAIR: SHX51

## 2011-09-15 ENCOUNTER — Encounter (INDEPENDENT_AMBULATORY_CARE_PROVIDER_SITE_OTHER): Payer: Self-pay | Admitting: Surgery

## 2011-09-15 ENCOUNTER — Ambulatory Visit (INDEPENDENT_AMBULATORY_CARE_PROVIDER_SITE_OTHER): Payer: PRIVATE HEALTH INSURANCE | Admitting: Surgery

## 2011-09-15 VITALS — BP 146/72 | HR 62 | Temp 98.2°F | Resp 12 | Ht 73.5 in | Wt 259.8 lb

## 2011-09-15 DIAGNOSIS — K439 Ventral hernia without obstruction or gangrene: Secondary | ICD-10-CM

## 2011-09-15 NOTE — Progress Notes (Signed)
Chief Complaint  Patient presents with  . Umbilical Hernia    new pt- eval painful umb hernia    HISTORY: Patient is a 46 year old white male who presents at her request of his employer nurse practitioner with a one-month history of abdominal pain at the level of the umbilicus. This has been intermittent. It is exacerbated by physical activity. It has gradually enlarged. He has had no signs or symptoms of obstruction. He has had no prior abdominal surgery. He has had no prior hernia repair.  Past Medical History  Diagnosis Date  . Chickenpox   . Arthritis   . 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 Was on coumadin for 6 months in 2004  . Sleep apnea 2004-2006    sleep study (severe)  . Umbilical hernia   . GERD (gastroesophageal reflux disease)      Current Outpatient Prescriptions  Medication Sig Dispense Refill  . esomeprazole (NEXIUM) 40 MG capsule Take 40 mg by mouth. Take 1 tablet by mouth two times a day          Allergies  Allergen Reactions  . Penicillins      Family History  Problem Relation Age of Onset  . Heart attack Father 45  . Leukemia Paternal Grandfather      History   Social History  . Marital Status: Married    Spouse Name: N/A    Number of Children: 1  . Years of Education: N/A   Occupational History  . Repairman    Social History Main Topics  . Smoking status: Current Everyday Smoker -- 1.0 packs/day for 25 years    Types: Cigarettes  . Smokeless tobacco: None  . 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 Drug Use: No     remote marijuana use   . Sexually Active: None   Other Topics Concern  . None   Social History Narrative   Regular exercise - no      REVIEW OF SYSTEMS - PERTINENT POSITIVES ONLY: No signs or symptoms of intestinal obstruction. No prior hernia repair. Always reducible.  EXAM: Filed Vitals:   09/15/11 0911  BP: 146/72  Pulse: 62    Temp: 98.2 F (36.8 C)  Resp: 12    HEENT: normocephalic; pupils equal and reactive; sclerae clear; dentition good; mucous membranes moist NECK:  symmetric on extension; no palpable anterior or posterior cervical lymphadenopathy; no supraclavicular masses; no tenderness CHEST: clear to auscultation bilaterally without rales, rhonchi, or wheezes CARDIAC: regular rate and rhythm without significant murmur; peripheral pulses are full ABDOMEN: soft without distension; bowel sounds present; no mass; no hepatosplenomegaly; Moderate rectus diastasis; small fascial defect approximately 2 cm above the umbilicus and slightly to the right of midline; hernia augments with Valsalva; it spontaneously reduces. There is mild tenderness. EXT:  non-tender without edema; no deformity NEURO: no gross focal deficits; no sign of tremor   LABORATORY RESULTS: See [facility] (CHL-Epic) for most recent results   RADIOLOGY RESULTS: See [facility] (CHL-Epic) for most recent results   IMPRESSION: Epigastric hernia, reducible  PLAN: The patient and I discussed all of the above issues. I provided him with written literature on hernia repair. We discussed the rectus diastasis and the fact that that does not require hernia repair and does not pose a health risk. We discussed the use of prosthetic mesh. We discussed the risk of  recurrence being approximately 5%. We discussed restrictions on his activities following the procedure and his return to work and full activity. He understands and wishes to proceed.  The risks and benefits of the procedure have been discussed at length with the patient.  The patient understands the proposed procedure, potential alternative treatments, and the course of recovery to be expected.  All of the patient's questions have been answered at this time.  The patient wishes to proceed with surgery.  Velora Heckler, MD, FACS General & Endocrine Surgery [facility]   Visit Diagnoses: 1. Hernia, epigastric     Primary Care Physician: Kerby Nora, MD, MD  Terri Piedra, NP, [place]

## 2011-09-15 NOTE — Patient Instructions (Signed)
Hernia  A hernia occurs when an internal organ pushes out through a weak spot in the abdominal wall. Hernias most commonly occur in the groin and around the navel. Hernias often can be pushed back into place (reduced). Most hernias tend to get worse over time. Some abdominal hernias can get stuck in the opening (irreducible or incarcerated hernia) and cannot be reduced. An irreducible abdominal hernia which is tightly squeezed into the opening is at risk for impaired blood supply (strangulated hernia). A strangulated hernia is a medical emergency. Because of the risk for an irreducible or strangulated hernia, surgery may be recommended to repair a hernia.  CAUSES    Heavy lifting.   Prolonged coughing.   Straining to have a bowel movement.   A cut (incision) made during an abdominal surgery.  HOME CARE INSTRUCTIONS    Bed rest is not required. You may continue your normal activities.   Avoid lifting more than 10 pounds (4.5 kg) or straining.   Cough gently. If you are a smoker it is best to stop. Even the best hernia repair can break down with the continual strain of coughing. Even if you do not have your hernia repaired, a cough will continue to aggravate the problem.   Do not wear anything tight over your hernia. Do not try to keep it in with an outside bandage or truss. These can damage abdominal contents if they are trapped within the hernia sac.   Eat a normal diet.   Avoid constipation. Straining over long periods of time will increase hernia size and encourage breakdown of repairs. If you cannot do this with diet alone, stool softeners may be used.  SEEK IMMEDIATE MEDICAL CARE IF:    You have a fever.   You develop increasing abdominal pain.   You feel nauseous or vomit.   Your hernia is stuck outside the abdomen, looks discolored, feels hard, or is tender.   You have any changes in your bowel habits or in the hernia that are unusual for you.   You have increased pain or swelling around the  hernia.   You cannot push the hernia back in place by applying gentle pressure while lying down.  MAKE SURE YOU:    Understand these instructions.   Will watch your condition.   Will get help right away if you are not doing well or get worse.  Document Released: 04/26/2005 Document Revised: 04/15/2011 Document Reviewed: 12/14/2007  ExitCare Patient Information 2012 [employer].

## 2011-09-27 ENCOUNTER — Ambulatory Visit (INDEPENDENT_AMBULATORY_CARE_PROVIDER_SITE_OTHER): Payer: PRIVATE HEALTH INSURANCE | Admitting: Surgery

## 2011-09-30 DIAGNOSIS — K436 Other and unspecified ventral hernia with obstruction, without gangrene: Secondary | ICD-10-CM

## 2011-10-28 ENCOUNTER — Ambulatory Visit (INDEPENDENT_AMBULATORY_CARE_PROVIDER_SITE_OTHER): Payer: PRIVATE HEALTH INSURANCE | Admitting: Surgery

## 2011-10-28 ENCOUNTER — Encounter (INDEPENDENT_AMBULATORY_CARE_PROVIDER_SITE_OTHER): Payer: Self-pay | Admitting: Surgery

## 2011-10-28 VITALS — BP 118/78 | HR 80 | Temp 97.6°F | Resp 16 | Ht 73.5 in | Wt 257.6 lb

## 2011-10-28 DIAGNOSIS — K439 Ventral hernia without obstruction or gangrene: Secondary | ICD-10-CM

## 2011-10-28 NOTE — Patient Instructions (Signed)
  COCOA BUTTER & VITAMIN E CREAM  ([employer] or other brand)  Apply cocoa butter/vitamin E cream to your incision 2 - 3 times daily.  Massage cream into incision for one minute with each application.  Use sunscreen (50 SPF or higher) for first 6 months after surgery if area is exposed to sun.  You may substitute Mederma or other scar reducing creams as desired.   

## 2011-10-28 NOTE — Progress Notes (Signed)
General Surgery [facility]  Visit Diagnoses: 1. Hernia, epigastric     HISTORY: Patient returns for postoperative visit having undergone repair of epigastric hernia with mesh.  EXAM: Surgical wound is well healed. No sign of seroma. No sign of infection. With Valsalva there is no sign of recurrence.  IMPRESSION: Status post repair of epigastric hernia with mesh  PLAN: Patient is restricted to 25 pounds lifting for the next 2 weeks. He will then resume normal activity gradually. He will begin applying topical creams to his incision. He will return to see me in this office as needed.  Velora Heckler, MD, FACS General & Endocrine Surgery [facility]

## 2012-06-09 ENCOUNTER — Encounter: Payer: Self-pay | Admitting: Internal Medicine

## 2012-06-16 ENCOUNTER — Telehealth: Payer: Self-pay | Admitting: Pulmonary Disease

## 2012-06-19 NOTE — Telephone Encounter (Signed)
Pt last OV 07/09/10 W/ RA.  Called and made an appt with RA FOR 3.13.14 W/ ra. Nothing further was needed

## 2012-07-20 ENCOUNTER — Ambulatory Visit: Payer: Self-pay | Admitting: Pulmonary Disease

## 2012-08-16 ENCOUNTER — Telehealth: Payer: Self-pay | Admitting: Pulmonary Disease

## 2012-08-16 NOTE — Telephone Encounter (Signed)
i spoke with pt. He was calling to let us know he had to r/s his appt with RA.

## 2012-08-17 ENCOUNTER — Ambulatory Visit: Payer: Self-pay | Admitting: Pulmonary Disease

## 2012-09-03 ENCOUNTER — Emergency Department (HOSPITAL_COMMUNITY): Payer: No Typology Code available for payment source

## 2012-09-03 ENCOUNTER — Emergency Department (HOSPITAL_COMMUNITY): Payer: No Typology Code available for payment source | Admitting: Anesthesiology

## 2012-09-03 ENCOUNTER — Encounter (HOSPITAL_COMMUNITY)
Admission: EM | Disposition: A | Discharge status: Home / Self Care | Payer: Self-pay | Source: Home / Self Care | Attending: Emergency Medicine

## 2012-09-03 ENCOUNTER — Observation Stay (HOSPITAL_COMMUNITY)
Admission: EM | Admit: 2012-09-03 | Discharge: 2012-09-04 | Disposition: A | Discharge status: Home / Self Care | Payer: No Typology Code available for payment source | Attending: Surgery | Admitting: Surgery

## 2012-09-03 ENCOUNTER — Encounter (HOSPITAL_COMMUNITY): Payer: Self-pay | Admitting: *Deleted

## 2012-09-03 ENCOUNTER — Encounter (HOSPITAL_COMMUNITY): Payer: Self-pay | Admitting: Anesthesiology

## 2012-09-03 DIAGNOSIS — R7989 Other specified abnormal findings of blood chemistry: Secondary | ICD-10-CM | POA: Insufficient documentation

## 2012-09-03 DIAGNOSIS — K810 Acute cholecystitis: Secondary | ICD-10-CM

## 2012-09-03 DIAGNOSIS — R1013 Epigastric pain: Secondary | ICD-10-CM | POA: Insufficient documentation

## 2012-09-03 DIAGNOSIS — K8000 Calculus of gallbladder with acute cholecystitis without obstruction: Principal | ICD-10-CM | POA: Insufficient documentation

## 2012-09-03 DIAGNOSIS — R7401 Elevation of levels of liver transaminase levels: Secondary | ICD-10-CM

## 2012-09-03 DIAGNOSIS — K8010 Calculus of gallbladder with chronic cholecystitis without obstruction: Secondary | ICD-10-CM

## 2012-09-03 DIAGNOSIS — E669 Obesity, unspecified: Secondary | ICD-10-CM | POA: Insufficient documentation

## 2012-09-03 DIAGNOSIS — R1011 Right upper quadrant pain: Secondary | ICD-10-CM

## 2012-09-03 HISTORY — PX: CHOLECYSTECTOMY: SHX55

## 2012-09-03 LAB — POCT I-STAT TROPONIN I: Troponin i, poc: 0.01 ng/mL (ref 0.00–0.08)

## 2012-09-03 LAB — CBC WITH DIFFERENTIAL/PLATELET
Basophils Absolute: 0 10*3/uL (ref 0.0–0.1)
Basophils Relative: 1 % (ref 0–1)
Eosinophils Absolute: 0.2 10*3/uL (ref 0.0–0.7)
Eosinophils Relative: 3 % (ref 0–5)
HCT: 43.2 % (ref 39.0–52.0)
Hemoglobin: 15.1 g/dL (ref 13.0–17.0)
Lymphocytes Relative: 16 % (ref 12–46)
Lymphs Abs: 1 10*3/uL (ref 0.7–4.0)
MCH: 32.4 pg (ref 26.0–34.0)
MCHC: 35 g/dL (ref 30.0–36.0)
MCV: 92.7 fL (ref 78.0–100.0)
Monocytes Absolute: 0.4 10*3/uL (ref 0.1–1.0)
Monocytes Relative: 7 % (ref 3–12)
Neutro Abs: 4.7 10*3/uL (ref 1.7–7.7)
Neutrophils Relative %: 74 % (ref 43–77)
Platelets: 88 10*3/uL — ABNORMAL LOW (ref 150–400)
RBC: 4.66 MIL/uL (ref 4.22–5.81)
RDW: 14 % (ref 11.5–15.5)
WBC: 6.4 10*3/uL (ref 4.0–10.5)

## 2012-09-03 LAB — COMPREHENSIVE METABOLIC PANEL
ALT: 98 U/L — ABNORMAL HIGH (ref 0–53)
AST: 53 U/L — ABNORMAL HIGH (ref 0–37)
Albumin: 4.1 g/dL (ref 3.5–5.2)
Alkaline Phosphatase: 65 U/L (ref 39–117)
BUN: 7 mg/dL (ref 6–23)
CO2: 31 mEq/L (ref 19–32)
Calcium: 9.8 mg/dL (ref 8.4–10.5)
Chloride: 101 mEq/L (ref 96–112)
Creatinine, Ser: 1.08 mg/dL (ref 0.50–1.35)
GFR calc Af Amer: >90 mL/min (ref >90)
GFR calc non Af Amer: 81 mL/min — ABNORMAL LOW (ref >90)
Glucose, Bld: 111 mg/dL — ABNORMAL HIGH (ref 70–99)
Potassium: 4 mEq/L (ref 3.5–5.1)
Sodium: 139 mEq/L (ref 135–145)
Total Bilirubin: 0.5 mg/dL (ref 0.3–1.2)
Total Protein: 7 g/dL (ref 6.0–8.3)

## 2012-09-03 LAB — LIPASE, BLOOD: Lipase: 24 U/L (ref 11–59)

## 2012-09-03 SURGERY — LAPAROSCOPIC CHOLECYSTECTOMY WITH INTRAOPERATIVE CHOLANGIOGRAM
Anesthesia: General | Site: Abdomen | Wound class: Clean Contaminated

## 2012-09-03 MED ORDER — LIDOCAINE HCL (CARDIAC) 20 MG/ML IV SOLN
INTRAVENOUS | Status: DC | PRN
  Administered 2012-09-03: 70 mg via INTRAVENOUS

## 2012-09-03 MED ORDER — PROPOFOL 10 MG/ML IV BOLUS
INTRAVENOUS | Status: DC | PRN
  Administered 2012-09-03: 250 mg via INTRAVENOUS

## 2012-09-03 MED ORDER — FENTANYL CITRATE 0.05 MG/ML IJ SOLN
INTRAMUSCULAR | Status: DC | PRN
  Administered 2012-09-03: 100 ug via INTRAVENOUS

## 2012-09-03 MED ORDER — OXYCODONE-ACETAMINOPHEN 5-325 MG PO TABS
1.0000 | ORAL_TABLET | ORAL | Status: DC | PRN
Start: 2012-09-03 — End: 2012-09-04

## 2012-09-03 MED ORDER — GI COCKTAIL ~~LOC~~
30.0000 mL | Freq: Once | ORAL | Status: AC
  Administered 2012-09-03: 30 mL via ORAL
  Filled 2012-09-03: qty 30

## 2012-09-03 MED ORDER — ONDANSETRON HCL 4 MG/2ML IJ SOLN
INTRAMUSCULAR | Status: DC | PRN
  Administered 2012-09-03: 4 mg via INTRAVENOUS

## 2012-09-03 MED ORDER — MIDAZOLAM HCL 5 MG/5ML IJ SOLN
INTRAMUSCULAR | Status: DC | PRN
  Administered 2012-09-03: 2 mg via INTRAVENOUS

## 2012-09-03 MED ORDER — GLYCOPYRROLATE 0.2 MG/ML IJ SOLN
INTRAMUSCULAR | Status: DC | PRN
  Administered 2012-09-03: 0.6 mg via INTRAVENOUS

## 2012-09-03 MED ORDER — LACTATED RINGERS IV SOLN
INTRAVENOUS | Status: DC | PRN
  Administered 2012-09-03: 10:00:00 via INTRAVENOUS

## 2012-09-03 MED ORDER — SODIUM CHLORIDE 0.9 % IV SOLN
INTRAVENOUS | Status: DC | PRN
  Administered 2012-09-03: 09:00:00 via INTRAVENOUS

## 2012-09-03 MED ORDER — MORPHINE SULFATE 4 MG/ML IJ SOLN
4.0000 mg | INTRAMUSCULAR | Status: DC | PRN
  Filled 2012-09-03: qty 1

## 2012-09-03 MED ORDER — NEOSTIGMINE METHYLSULFATE 1 MG/ML IJ SOLN: INTRAMUSCULAR | Status: DC | PRN

## 2012-09-03 MED ORDER — ROCURONIUM BROMIDE 100 MG/10ML IV SOLN
INTRAVENOUS | Status: DC | PRN
  Administered 2012-09-03: 40 mg via INTRAVENOUS

## 2012-09-03 MED ORDER — HYDROMORPHONE HCL PF 1 MG/ML IJ SOLN: 0.2500 mg | INTRAMUSCULAR | Status: DC | PRN

## 2012-09-03 MED ORDER — SUCCINYLCHOLINE CHLORIDE 20 MG/ML IJ SOLN
INTRAMUSCULAR | Status: DC | PRN
  Administered 2012-09-03: 140 mg via INTRAVENOUS

## 2012-09-03 MED ORDER — SODIUM CHLORIDE 0.9 % IV SOLN
1.0000 g | Freq: Once | INTRAVENOUS | Status: AC
  Administered 2012-09-03: 1 g via INTRAVENOUS
  Filled 2012-09-03: qty 1

## 2012-09-03 MED ORDER — ONDANSETRON HCL 4 MG/2ML IJ SOLN
4.0000 mg | Freq: Once | INTRAMUSCULAR | Status: AC
  Administered 2012-09-03: 4 mg via INTRAVENOUS
  Filled 2012-09-03: qty 2

## 2012-09-03 MED ORDER — NEOSTIGMINE METHYLSULFATE 1 MG/ML IJ SOLN
INTRAMUSCULAR | Status: DC | PRN
  Administered 2012-09-03: 1 mg via INTRAVENOUS
  Administered 2012-09-03: 5 mg via INTRAVENOUS

## 2012-09-03 MED ORDER — HYDROMORPHONE HCL PF 1 MG/ML IJ SOLN
1.0000 mg | INTRAMUSCULAR | Status: AC
  Administered 2012-09-03: 1 mg via INTRAVENOUS
  Filled 2012-09-03: qty 1

## 2012-09-03 MED ORDER — ACETAMINOPHEN 325 MG PO TABS: 650.0000 mg | ORAL_TABLET | ORAL | Status: DC | PRN

## 2012-09-03 MED ORDER — PANTOPRAZOLE SODIUM 40 MG PO TBEC
40.0000 mg | DELAYED_RELEASE_TABLET | Freq: Every day | ORAL | Status: DC
  Administered 2012-09-03 – 2012-09-04 (×2): 40 mg via ORAL
  Filled 2012-09-03 (×2): qty 1

## 2012-09-03 MED ORDER — ONDANSETRON HCL 4 MG/2ML IJ SOLN: 4.0000 mg | Freq: Four times a day (QID) | INTRAMUSCULAR | Status: DC | PRN

## 2012-09-03 MED ORDER — ONDANSETRON HCL 4 MG PO TABS: 4.0000 mg | ORAL_TABLET | Freq: Four times a day (QID) | ORAL | Status: DC | PRN

## 2012-09-03 MED ORDER — KCL IN DEXTROSE-NACL 20-5-0.45 MEQ/L-%-% IV SOLN
INTRAVENOUS | Status: DC
  Administered 2012-09-03 – 2012-09-04 (×2): via INTRAVENOUS
  Filled 2012-09-03 (×4): qty 1000

## 2012-09-03 SURGICAL SUPPLY — 40 items
APPLIER CLIP 5 13 M/L LIGAMAX5 (MISCELLANEOUS) ×2
APPLIER CLIP ROT 10 11.4 M/L (STAPLE)
BLADE SURG ROTATE 9660 (MISCELLANEOUS) IMPLANT
CANISTER SUCTION 2500CC (MISCELLANEOUS) ×2 IMPLANT
CHLORAPREP W/TINT 26ML (MISCELLANEOUS) ×2 IMPLANT
CLIP APPLIE 5 13 M/L LIGAMAX5 (MISCELLANEOUS) ×1 IMPLANT
CLIP APPLIE ROT 10 11.4 M/L (STAPLE) IMPLANT
CLOTH BEACON ORANGE TIMEOUT ST (SAFETY) ×2 IMPLANT
COVER MAYO STAND STRL (DRAPES) ×2 IMPLANT
COVER SURGICAL LIGHT HANDLE (MISCELLANEOUS) ×2 IMPLANT
DECANTER SPIKE VIAL GLASS SM (MISCELLANEOUS) ×4 IMPLANT
DERMABOND ADVANCED (GAUZE/BANDAGES/DRESSINGS) ×1
DERMABOND ADVANCED .7 DNX12 (GAUZE/BANDAGES/DRESSINGS) ×1 IMPLANT
DRAPE C-ARM 42X72 X-RAY (DRAPES) ×2 IMPLANT
DRAPE UTILITY 15X26 W/TAPE STR (DRAPE) ×4 IMPLANT
ELECT REM PT RETURN 9FT ADLT (ELECTROSURGICAL) ×2
ELECTRODE REM PT RTRN 9FT ADLT (ELECTROSURGICAL) ×1 IMPLANT
FILTER SMOKE EVAC LAPAROSHD (FILTER) IMPLANT
GLOVE BIO SURGEON STRL SZ8 (GLOVE) ×2 IMPLANT
GLOVE BIOGEL PI IND STRL 8 (GLOVE) ×1 IMPLANT
GLOVE BIOGEL PI INDICATOR 8 (GLOVE) ×1
GOWN PREVENTION PLUS XLARGE (GOWN DISPOSABLE) ×2 IMPLANT
GOWN STRL NON-REIN LRG LVL3 (GOWN DISPOSABLE) ×6 IMPLANT
KIT BASIN OR (CUSTOM PROCEDURE TRAY) ×2 IMPLANT
KIT ROOM TURNOVER OR (KITS) ×2 IMPLANT
NS IRRIG 1000ML POUR BTL (IV SOLUTION) ×2 IMPLANT
PAD ARMBOARD 7.5X6 YLW CONV (MISCELLANEOUS) ×2 IMPLANT
POUCH SPECIMEN RETRIEVAL 10MM (ENDOMECHANICALS) ×2 IMPLANT
SCISSORS LAP 5X35 DISP (ENDOMECHANICALS) IMPLANT
SET CHOLANGIOGRAPH 5 50 .035 (SET/KITS/TRAYS/PACK) ×2 IMPLANT
SET IRRIG TUBING LAPAROSCOPIC (IRRIGATION / IRRIGATOR) ×2 IMPLANT
SLEEVE ENDOPATH XCEL 5M (ENDOMECHANICALS) ×4 IMPLANT
SPECIMEN JAR SMALL (MISCELLANEOUS) ×2 IMPLANT
SUT VIC AB 4-0 PS2 27 (SUTURE) ×2 IMPLANT
TOWEL OR 17X24 6PK STRL BLUE (TOWEL DISPOSABLE) ×2 IMPLANT
TOWEL OR 17X26 10 PK STRL BLUE (TOWEL DISPOSABLE) ×2 IMPLANT
TRAY LAPAROSCOPIC (CUSTOM PROCEDURE TRAY) ×2 IMPLANT
TROCAR XCEL BLUNT TIP 100MML (ENDOMECHANICALS) ×2 IMPLANT
TROCAR XCEL NON-BLD 5MMX100MML (ENDOMECHANICALS) ×2 IMPLANT
WATER STERILE IRR 1000ML POUR (IV SOLUTION) IMPLANT

## 2012-09-03 NOTE — Preoperative (Signed)
Beta Blockers   Reason not to administer Beta Blockers:Not Applicable 

## 2012-09-03 NOTE — H&P (Signed)
Kenneth Mckee is an 47 y.o. male.   Chief Complaint: Right upper quadrant abdominal pain HPI: Patient is spicy meal last night. Shortly thereafter he developed upper abdominal pain. It localized more to the right upper quadrant. He was unable to sleep. Around 2 in the morning he came to the emergency department for evaluation. Workup demonstrates mild elevation of liver function tests and ultrasound shows gallstone stuck in the neck of the gallbladder with a positive Murphy's sign. Of note, he is status post epigastric hernia repair by Dr. Gerrit Friends last year.  Pain has improved since receiving pain medicine but has not resolved.  Past Medical History  Diagnosis Date  . Chickenpox   . Arthritis   . 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 Was on coumadin for 6 months in 2004  . Sleep apnea 2004-2006    sleep study (severe)  . Umbilical hernia   . GERD (gastroesophageal reflux disease)     Past Surgical History  Procedure Laterality Date  . Cervical discectomy  2005  . Tonsillectomy    . Nasal sinus surgery    . Hernia repair  2013    epigastric hernia repair w/mesh    Family History  Problem Relation Age of Onset  . Heart attack Father 43  . Leukemia Paternal Grandfather    Social History:  reports that he has been smoking Cigarettes.  He has a 25 pack-year smoking history. He does not have any smokeless tobacco history on file. He reports that he does not drink alcohol or use illicit drugs.  Allergies:  Allergies  Allergen Reactions  . Penicillins Other (See Comments)    From childhood     (Not in a hospital admission)  Results for orders placed during the hospital encounter of 09/03/12 (from the past 48 hour(s))  CBC WITH DIFFERENTIAL     Status: Abnormal   Collection Time    09/03/12  5:26 AM      Result Value Range   WBC 6.4  4.0 - 10.5 K/uL   RBC 4.66  4.22 - 5.81 MIL/uL   Hemoglobin 15.1  13.0 - 17.0 g/dL   HCT 45.4   09.8 - 11.9 %   MCV 92.7  78.0 - 100.0 fL   MCH 32.4  26.0 - 34.0 pg   MCHC 35.0  30.0 - 36.0 g/dL   RDW 14.7  82.9 - 56.2 %   Platelets 88 (*) 150 - 400 K/uL   Neutrophils Relative 74  43 - 77 %   Neutro Abs 4.7  1.7 - 7.7 K/uL   Lymphocytes Relative 16  12 - 46 %   Lymphs Abs 1.0  0.7 - 4.0 K/uL   Monocytes Relative 7  3 - 12 %   Monocytes Absolute 0.4  0.1 - 1.0 K/uL   Eosinophils Relative 3  0 - 5 %   Eosinophils Absolute 0.2  0.0 - 0.7 K/uL   Basophils Relative 1  0 - 1 %   Basophils Absolute 0.0  0.0 - 0.1 K/uL  COMPREHENSIVE METABOLIC PANEL     Status: Abnormal   Collection Time    09/03/12  5:26 AM      Result Value Range   Sodium 139  135 - 145 mEq/L   Potassium 4.0  3.5 - 5.1 mEq/L   Chloride 101  96 - 112 mEq/L   CO2 31  19 - 32 mEq/L  Glucose, Bld 111 (*) 70 - 99 mg/dL   BUN 7  6 - 23 mg/dL   Creatinine, Ser 1.61  0.50 - 1.35 mg/dL   Calcium 9.8  8.4 - 09.6 mg/dL   Total Protein 7.0  6.0 - 8.3 g/dL   Albumin 4.1  3.5 - 5.2 g/dL   AST 53 (*) 0 - 37 U/L   ALT 98 (*) 0 - 53 U/L   Alkaline Phosphatase 65  39 - 117 U/L   Total Bilirubin 0.5  0.3 - 1.2 mg/dL   GFR calc non Af Amer 81 (*) >90 mL/min   GFR calc Af Amer >90  >90 mL/min   Comment:            The eGFR has been calculated     using the CKD EPI equation.     This calculation has not been     validated in all clinical     situations.     eGFR's persistently     <90 mL/min signify     possible Chronic Kidney Disease.  LIPASE, BLOOD     Status: None   Collection Time    09/03/12  5:26 AM      Result Value Range   Lipase 24  11 - 59 U/L  POCT I-STAT TROPONIN I     Status: None   Collection Time    09/03/12  5:38 AM      Result Value Range   Troponin i, poc 0.01  0.00 - 0.08 ng/mL   Comment 3            Comment: Due to the release kinetics of cTnI,     a negative result within the first hours     of the onset of symptoms does not rule out     myocardial infarction with certainty.     If  myocardial infarction is still suspected,     repeat the test at appropriate intervals.   US Abdomen Limited Ruq  09/03/2012  *RADIOLOGY REPORT*  Clinical Data:  Epigastric pain.  Query enlarged duct.  LIMITED ABDOMINAL ULTRASOUND - RIGHT UPPER QUADRANT  Comparison:  None.  Findings:  Gallbladder:  Mild gallbladder distension with finding of stone in the gallbladder neck which is not mobile.  The stone measures about 2.3 cm.  No pericholecystic edema or sludge.  Murphy's sign is positive.  Findings are consistent with acute cholecystitis in the appropriate clinical setting.  Common bile duct:  Normal caliber.  Diameter is measured at 6.1 mm.  Liver:  Diffusely increased liver parenchymal echotexture suggesting diffuse fatty infiltration.  No focal lesions are demonstrated.  No intrahepatic bile duct dilatation.  IMPRESSION: Non mobile stone in the gallbladder neck with mild gallbladder distension and positive Murphy's sign.  Findings are consistent with acute cholecystitis in the appropriate clinical setting.  No bile duct dilatation.                    Original Report Authenticated By: Burman Nieves, M.D.     Review of Systems  Constitutional: Positive for malaise/fatigue.  HENT: Negative.   Eyes: Negative.   Respiratory: Negative.   Cardiovascular: Negative.   Gastrointestinal: Positive for nausea and abdominal pain. Negative for vomiting and diarrhea.  Genitourinary: Negative.   Musculoskeletal: Negative.   Skin: Negative.   Neurological: Negative.   Endo/Heme/Allergies: Negative.     Blood pressure 162/99, pulse 72, temperature 98.3 F (36.8 C), resp. rate 20, SpO2 99.00%. Physical  Exam  Constitutional: He is oriented to person, place, and time. He appears well-developed and well-nourished. No distress.  HENT:  Head: Normocephalic and atraumatic.  Mouth/Throat: Oropharynx is clear and moist. No oropharyngeal exudate.  Eyes: EOM are normal. Pupils are equal, round, and reactive to  light. No scleral icterus.  Neck: Normal range of motion. Neck supple. No tracheal deviation present.  Cardiovascular: Normal rate, regular rhythm, normal heart sounds and intact distal pulses.   No murmur heard. Respiratory: Effort normal and breath sounds normal. No stridor. He has no wheezes. He has no rales.  GI: Soft. He exhibits no distension. There is tenderness. There is guarding. There is no rebound.  Tender right upper quadrant with voluntary guarding, no generalized tenderness, scar supraumbilical region, no palpable hernia recurrence, bowel sounds hypoactive  Musculoskeletal: Normal range of motion.  Neurological: He is alert and oriented to person, place, and time. He exhibits normal muscle tone.  Skin: Skin is warm.     Assessment/Plan Acute cholecystitis and mild transaminitis. I've offered arthroscopic cholecystectomy with intraoperative cholangiogram. He is receiving intravenous antibiotics, Invanz now. This was ordered by the emergency department physician.I discussed the procedure in detail.  We discussed the risks and benefits of a laparoscopic cholecystectomy and possible cholangiogram including, but not limited to bleeding, infection, injury to surrounding structures such as the intestine or liver, bile leak, retained gallstones, need to convert to an open procedure, prolonged diarrhea, blood clots such as  DVT, common bile duct injury, anesthesia risks, and possible need for additional procedures.  The likelihood of improvement in symptoms and return to the patient's normal status is good. We discussed the typical post-operative recovery course. He is agreeable    Wendy Mikles E 09/03/2012, 8:45 AM

## 2012-09-03 NOTE — Progress Notes (Signed)
Utilization review completed.  

## 2012-09-03 NOTE — ED Notes (Signed)
Epi gastric pain. Started yesterday. Taking Prilosec, tums with no relief. Eats spicy foods.

## 2012-09-03 NOTE — ED Provider Notes (Signed)
History     CSN: [id number]  Arrival date & time 09/03/12  4132   First MD Initiated Contact with Patient 09/03/12 [phone]      Chief Complaint  Patient presents with  . Abdominal Pain    (Consider location/radiation/quality/duration/timing/severity/associated sxs/prior treatment) HPI Kenneth Mckee is a 47 y.o. male with a history of reflux, patient has been prescribed Nexium and then taking a over-the-counter equivalent. Patient started having epigastric and right upper quadrant pain about 1500-1600 yesterday, no associated nausea vomiting or diarrhea. There is just some discomfort there starting in the afternoon and then after eating approximately 1830 surgery having worsening pain. Pain is currently 20 5/10, it is described as "someone got their hand and there is pulling my guts out," patient is had a normal bowel movement today and has been passing gas. Patient's had no fevers, no chills, has never had a pain like this before. Last time he ate was 1830, no vomiting, he suffered some ice tea. He says he couldn't sleep and the pain definitely got worse after eating. He says he's never had any pain after eating in the past. Patient does not drink alcohol and uses no recreational drugs and smokes pack of cigarettes daily for the past 25 years. No chest pains, no shortness of breath, no headaches, myalgias, rash.  Past Medical History  Diagnosis Date  . Chickenpox   . Arthritis   . 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 Was on coumadin for 6 months in 2004  . Sleep apnea 2004-2006    sleep study (severe)  . Umbilical hernia   . GERD (gastroesophageal reflux disease)     Past Surgical History  Procedure Laterality Date  . Cervical discectomy  2005  . Tonsillectomy    . Nasal sinus surgery    . Hernia repair  2013    epigastric hernia repair w/mesh    Family History  Problem Relation Age of Onset  . Heart attack Father 57  . Leukemia  Paternal Grandfather     History  Substance Use Topics  . Smoking status: Current Every Day Smoker -- 1.00 packs/day for 25 years    Types: Cigarettes  . Smokeless tobacco: Not on file  . 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  Allergies  Penicillins  Home Medications   Current Outpatient Rx  Name  Route  Sig  Dispense  Refill  . esomeprazole (NEXIUM) 40 MG capsule   Oral   Take 40 mg by mouth. Take 1 tablet by mouth two times a day          . HYDROcodone-acetaminophen (NORCO) 5-325 MG per tablet                 BP 162/99  Pulse 72  Temp(Src) 98.3 F (36.8 C)  Resp 20  SpO2 99%  Physical Exam  Nursing notes reviewed.  Electronic medical record reviewed. VITAL SIGNS:   Filed Vitals:   09/03/12 0404 09/03/12 0844  BP: 162/99 94/77  Pulse: 72   Temp: 98.3 F (36.8 C)   Resp: 20 18  SpO2: 99% 100%   CONSTITUTIONAL: Awake, oriented x4, appears non-toxic HENT: Atraumatic, normocephalic, oral mucosa pink and moist, airway patent. Nares patent  without drainage. External ears normal. EYES: Conjunctiva clear, EOMI, PERRLA NECK: Trachea midline, non-tender, supple CARDIOVASCULAR: Normal heart rate, Normal rhythm, No murmurs, rubs, gallops PULMONARY/CHEST: Clear to auscultation, no rhonchi, wheezes, or rales. Symmetrical breath sounds. Non-tender. ABDOMINAL: Non-distended, soft, tenderness to palpation in the right upper quadrant with positive Murphy sign, no rebound tenderness. Voluntary guarding is present.  BS normal. NEUROLOGIC: Non-focal, moving all four extremities, no gross sensory or motor deficits. EXTREMITIES: No clubbing, cyanosis, or edema SKIN: Warm, Dry, No erythema, No rash  ED Course  [place] bedside Date/Time: 09/03/2012 4:30 AM Performed by: Jones Skene Authorized by: Jones Skene Consent: Verbal consent obtained. Comments: Bedside ultrasound showed a dilated gallbladder and dilated bile duct concerning for obstructive stone   (including critical care time)  Date: 09/03/2012  Rate: 70  Rhythm: normal sinus rhythm  QRS Axis: normal  Intervals: normal  ST/T Wave abnormalities: normal  Conduction Disutrbances: RBBB  Narrative Interpretation: Right bundle branch block, no prior EKGs to compare with, nonischemic EKG   Labs Reviewed  CBC WITH DIFFERENTIAL  COMPREHENSIVE METABOLIC PANEL  LIPASE, BLOOD  URINALYSIS, MICROSCOPIC ONLY   US Abdomen Limited Ruq  09/03/2012  *RADIOLOGY REPORT*  Clinical Data:  Epigastric pain.  Query enlarged duct.  LIMITED ABDOMINAL ULTRASOUND - RIGHT UPPER QUADRANT  Comparison:  None.  Findings:  Gallbladder:  Mild gallbladder distension with finding of stone in the gallbladder neck which is not mobile.  The stone measures about 2.3 cm.  No pericholecystic edema or sludge.  Murphy's sign is positive.  Findings are consistent with acute cholecystitis in the appropriate clinical setting.  Common bile duct:  Normal caliber.  Diameter is measured at 6.1 mm.  Liver:  Diffusely increased liver parenchymal echotexture suggesting diffuse fatty infiltration.  No focal lesions are demonstrated.  No intrahepatic bile duct dilatation.  IMPRESSION: Non mobile stone in the gallbladder neck with mild gallbladder distension and positive Murphy's sign.  Findings are consistent with acute cholecystitis in the appropriate clinical setting.  No bile duct dilatation.                    Original Report Authenticated By: Burman Nieves, M.D.      1. Acute cholecystitis   2. Transaminitis   3. Right upper quadrant pain       MDM  Kenneth Mckee is a 47 y.o. male presents with epigastric and right upper quadrant pain suggestive of GERD, ulcer versus possible gallbladder pathology. Bedside ultrasound is very concerning for an obstructing stone. Patient  labs obtained and sent to ultrasound for formal study. Pain treated aggressively.  Labs consistent with possible obstruction with slight elevations in AST and ALT, patient denies drinking alcohol, alkaline phosphatase however and bilirubin are within normal limits.  Ultrasound report is a nonmobile stone in the gallbladder neck mild gallbladder distention positive Murphy's sign consistent with acute cholecystitis. This is consistent with clinical exam. Discussed with Dr. Laurell Josephs, patient to be evaluated for urgent cholecystectomy. Patient given Pincus Sanes in anticipation of surgery.   09/03/2012 8:55 AM To OR    Jones Skene, MD 09/03/12 [phone]

## 2012-09-03 NOTE — Anesthesia Preprocedure Evaluation (Addendum)
Anesthesia Evaluation  Patient identified by MRN, date of birth, ID band Patient awake    Reviewed: Allergy & Precautions, H&P , NPO status   History of Anesthesia Complications Negative for: history of anesthetic complications  Airway Mallampati: III TM Distance: >3 FB Neck ROM: Full    Dental  (+) Teeth Intact and Dental Advisory Given   Pulmonary sleep apnea and Continuous Positive Airway Pressure Ventilation , Current Smoker,  breath sounds clear to auscultation        Cardiovascular negative cardio ROS  Rhythm:Irregular Rate:Normal     Neuro/Psych negative neurological ROS     GI/Hepatic Neg liver ROS, GERD-  Medicated and Controlled,History of abdominal pain noted.   Endo/Other  negative endocrine ROS  Renal/GU negative Renal ROS     Musculoskeletal negative musculoskeletal ROS (+)   Abdominal   Peds  Hematology negative hematology ROS (+)   Anesthesia Other Findings   Reproductive/Obstetrics negative OB ROS                         Anesthesia Physical Anesthesia Plan  ASA: III  Anesthesia Plan: General   Post-op Pain Management:    Induction: Intravenous, Rapid sequence and Cricoid pressure planned  Airway Management Planned: Oral ETT  Additional Equipment:   Intra-op Plan:   Post-operative Plan: Extubation in OR  Informed Consent: I have reviewed the patients History and Physical, chart, labs and discussed the procedure including the risks, benefits and alternatives for the proposed anesthesia with the patient or authorized representative who has indicated his/her understanding and acceptance.   Dental advisory given  Plan Discussed with: CRNA, Anesthesiologist and Surgeon  Anesthesia Plan Comments:         Anesthesia Quick Evaluation

## 2012-09-03 NOTE — Anesthesia Procedure Notes (Signed)
Procedure Name: Intubation Date/Time: 09/03/2012 9:35 AM Performed by: Lovie Chol Pre-anesthesia Checklist: Patient identified, Emergency Drugs available, Suction available, Patient being monitored and Timeout performed Patient Re-evaluated:Patient Re-evaluated prior to inductionOxygen Delivery Method: Circle system utilized Preoxygenation: Pre-oxygenation with 100% oxygen Intubation Type: Rapid sequence, IV induction and Cricoid Pressure applied Grade View: Grade I Tube type: Oral Tube size: 7.5 mm Number of attempts: 1 Airway Equipment and Method: Stylet and Video-laryngoscopy Placement Confirmation: positive ETCO2,  CO2 detector,  ETT inserted through vocal cords under direct vision and breath sounds checked- equal and bilateral Secured at: 22 cm Tube secured with: Tape Dental Injury: Teeth and Oropharynx as per pre-operative assessment  Difficulty Due To: Difficulty was anticipated, Difficult Airway- due to reduced neck mobility and Difficult Airway- due to limited oral opening Future Recommendations: Recommend- induction with short-acting agent, and alternative techniques readily available Comments: Patient with previous cervical fusion - elective video-laryngoscopy planned.

## 2012-09-03 NOTE — Op Note (Signed)
09/03/2012  10:46 AM  PATIENT:  Kenneth Mckee  47 y.o. male  PRE-OPERATIVE DIAGNOSIS: Acute cholecystitis  POST-OPERATIVE DIAGNOSIS:  Acute cholecystitis  PROCEDURE:  Procedure(s): LAPAROSCOPIC CHOLECYSTECTOMY WITH INTRAOPERATIVE CHOLANGIOGRAM  SURGEON:  Surgeon(s): Liz Malady, MD  PHYSICIAN ASSISTANT:   ASSISTANTS: none   ANESTHESIA:   local and general  EBL:  Total I/O In: 1000 [I.V.:1000] Out: -   BLOOD ADMINISTERED:none  DRAINS: none   SPECIMEN:  Excision  DISPOSITION OF SPECIMEN:  PATHOLOGY  COUNTS:  YES  DICTATION: Reubin Milan Dictation  Patient presented to the emergency room with acute cholecystitis. He is brought for cholecystectomy and cholangiogram. He was identified in the preop holding area. Informed consent was obtained. He received intravenous antibiotics. He was brought to the operating room. General endotracheal anesthesia was administered by the anesthesia staff. Abdomen was prepped and draped in sterile fashion. Time out procedure was done. Infraumbilical region was infiltrated with quarter percent Marcaine with epinephrine. Infraumbilical incision was made. Subcutaneous tissues were dissected down revealing the anterior fascia. This was divided sharply. Peritoneal cavity was entered under direct vision. Some omental adhesions were bluntly swept away. These were likely related to his previous epigastric hernia repair. Once this bit of omentum was swept down A 0 Vicryl pursestring suture was placed in the fascia and the Hassan trocar was inserted into the abdomen.Abdomen was insufflated with carbon dioxide in standard fashion. Under direct vision, and 5mm epigastric and 2 right-sided 5mm ports were placed. Local local was used for all port sites. The gallbladder was tense and distended. It was drained with the Nahzat.  This allowed the dome to be grasped. The dome was retracted superior medially. The infundibulum was retracted inferior laterally. There was a lot  of filmy omental adhesions to the infundibulum of the gallbladder. These were all gently swept down. Once infundibulum was more exposed, dissection began laterally and progressed medially. Identified the cystic duct. This was circumferentially dissected until a critical view a window was achieved and the cystic duct the liver and the gallbladder. Once this was achieved, a clip was placed on the infundibular cystic duct junction. A small nick was made in the cystic duct. Cook cholangiogram catheter was inserted. Intraoperative cholangiogram demonstrated no common bile duct filling defects and good flow of contrast into the duodenum. Cholangiogram catheter was removed. Cystic duct was clipped 3 times proximally and divided. Further dissection revealed a small anterior branch and a larger posterior branch of the cystic artery. These were dissected out right along the gallbladder. Each was clipped twice proximally once distally and divided. Gallbladder was taken off the liver bed with Bovie cautery. Gallbladder was placed in an Endo Catch bag and removed from the abdomen via the infraumbilical port site. Liver bed was copiously irrigated. Hemostasis was obtained with cautery. Irrigation fluid evacuated. Clips remain in good position. There was no further bleeding or noted bile staining. 4 quadrant inspection was unrevealing.  Ports were removed under direct vision.Pneumoperitoneum was released.Infra-umbilical fascia was closed by tying the pursestring suture and care was taken not to trap any intra-abdominal contents. All 4 wounds were copiously irrigated and the skin of each was closed with running 4-0 Monocryl subcuticular and Dermabond. All counts were correct. Patient tolerated procedure well without apparent complication and was taken recovery in stable condition.  PATIENT DISPOSITION:  PACU - hemodynamically stable.   Delay start of Pharmacological VTE agent (>24hrs) due to surgical blood loss or risk of  bleeding:  no  Violeta Gelinas, MD,  MPH, FACS Pager: [phone]  4/27/201410:46 AM

## 2012-09-04 ENCOUNTER — Telehealth (INDEPENDENT_AMBULATORY_CARE_PROVIDER_SITE_OTHER): Payer: Self-pay

## 2012-09-04 ENCOUNTER — Encounter (HOSPITAL_COMMUNITY): Payer: Self-pay | Admitting: General Surgery

## 2012-09-04 NOTE — Discharge Summary (Signed)
Physician Discharge Summary  Patient ID: Kenneth Mckee MRN: [id number] DOB/AGE: 46-Jul-1967 46 y.o.  Admit date: 09/03/2012 Discharge date: 09/04/2012  Discharge Diagnoses Patient Active Problem List   Diagnosis Date Noted  . Acute cholecystitis 09/03/2012  . Transaminitis 09/03/2012  . Hernia, epigastric 09/15/2011  . Chickenpox   . Arthritis   . Phlebitis   . Sleep apnea   . OBESITY 06/23/2010  . PREDIABETES 05/29/2010  . LOW HDL 05/28/2010  . ALCOHOL ABUSE, IN REMISSION 05/22/2010  . ARTHRITIS 05/22/2010  . 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 SLEEP APNEA 05/22/2010  . EPIGASTRIC PAIN 05/22/2010  . CHICKENPOX, HX OF 05/22/2010    Consultants None   Procedures Laparoscopic cholecystectomy by Dr. Violeta Gelinas   HPI: Patient ate a spicy meal the night prior to presentation. Shortly thereafter he developed upper abdominal pain. It localized more to the right upper quadrant. He was unable to sleep. Around 2 in the morning he came to the emergency department for evaluation. Workup demonstrated mild elevation of liver function tests and an ultrasound showed a gallstone stuck in the neck of the gallbladder with a positive Murphy's sign. Of note, he is status post epigastric hernia repair by Dr. Gerrit Friends last year. Pain has improved since receiving pain medicine but has not resolved. He was taken to the OR for the above-mentioned procedure.   Hospital Course: The patient did excellently post-operatively. He had no appreciative pain and his abdominal symptoms had resolved. He was discharged home in improved condition.      Medication List    TAKE these medications       esomeprazole 40 MG capsule  Commonly known as:  NEXIUM  Take 40 mg by mouth. Take 1 tablet by mouth two times a day             Follow-up Information   Follow up with [facility] On 09/19/2012. (We will call with appointment time)    Contact information:   [address]    [place] [address] [phone]       Signed: Freeman Caldron, PA-C Pager: [phone] General Trauma PA Pager: [phone]  09/04/2012, 11:18 AM

## 2012-09-04 NOTE — Transfer of Care (Signed)
Immediate Anesthesia Transfer of Care Note  Patient: Kenneth Mckee  Procedure(s) Performed: Procedure(s): LAPAROSCOPIC CHOLECYSTECTOMY WITH INTRAOPERATIVE CHOLANGIOGRAM (N/A)  Patient Location: PACU  Anesthesia Type:General  Level of Consciousness: awake, oriented and patient cooperative  Airway & Oxygen Therapy: Patient Spontanous Breathing and Patient connected to face mask oxygen  Post-op Assessment: Report given to PACU RN and Post -op Vital signs reviewed and stable  Post vital signs: Reviewed and stable  Complications: No apparent anesthesia complications

## 2012-09-04 NOTE — Progress Notes (Signed)
Patient ID: TRAJAN GROVE, male   DOB: 02-Sep-1965, 47 y.o.   MRN: [id number]   LOS: 1 day   Subjective: No c/o. Ready to go home.  Objective: Vital signs in last 24 hours: Temp:  [97.3 F (36.3 C)-98.6 F (37 C)] 98.1 F (36.7 C) (04/28 0923) Pulse Rate:  [59-100] 95 (04/28 0923) Resp:  [15-20] 18 (04/28 0923) BP: (121-152)/(68-89) 132/68 mmHg (04/28 0923) SpO2:  [96 %-100 %] 96 % (04/28 0923) Weight:  [262 lb (118.842 kg)] 262 lb (118.842 kg) (04/27 1307)    Physical Exam General appearance: alert and no distress GI: Soft, port sites C/D/I   Assessment/Plan: Acute cholecystitis -- D/C home   Freeman Caldron, PA-C Pager: [phone]   09/04/2012

## 2012-09-04 NOTE — Telephone Encounter (Signed)
Message copied by Ivory Broad on Mon Sep 04, 2012  3:48 PM ------      Message from: Rise Paganini      Created: Mon Sep 04, 2012  3:01 PM      Regarding: Kenneth Mckee      Contact: [phone]       Patient is requesting pain medication, not necessarily a strong medicine. He also has questions about Nexium and Prilosec that he previously has taken. He is inquiring about a prescription for one of them. Please call. Thank you. ------

## 2012-09-04 NOTE — Telephone Encounter (Signed)
I called the pt back and he is just having a little pain.  He doesn't want narcotics.  I said he can take Tylenol or Ibuprofen.  He asked if we can call in Ibuprofen so his insurance will cover it.  He also asked for a prescription for Nexium because it works better than the Prilosec he is on.  His insurance changed and he lost the prescription he got for Nexium.  I paged Earney Hamburg PA who discharged him today.  He said I can call in Ibuprofen 800mg  po tid for 2 weeks and Nexium 40 mg po qd # 30.  He needs to get his primary md to resume prescribing the Nexium after a month.  [facility] with [facility].  I notified the pt.  He knows he needs to get his primary care md or whoever prescribed the Nexium before to take that over.

## 2012-09-05 ENCOUNTER — Ambulatory Visit: Payer: Self-pay | Admitting: Pulmonary Disease

## 2012-09-11 ENCOUNTER — Telehealth (INDEPENDENT_AMBULATORY_CARE_PROVIDER_SITE_OTHER): Payer: Self-pay

## 2012-09-11 NOTE — Telephone Encounter (Signed)
LATE ENTRY :  Pt called this am and reported a sharp pain on the right side near where the gallbladder was.  It comes and goes.  It feels like gas pain.  He is eating ok.  His bowels are loosely moving.  He has no fever.  He hasn't taken anything for pain.  I told him this sounds normal.  Make sure to get up and move around.  Call us if anything changes or gets worse.

## 2012-09-14 NOTE — Anesthesia Postprocedure Evaluation (Signed)
  Anesthesia Post-op Note  Patient: Kenneth Mckee  Procedure(s) Performed: Procedure(s): LAPAROSCOPIC CHOLECYSTECTOMY WITH INTRAOPERATIVE CHOLANGIOGRAM (N/A)  Patient Location: PACU  Anesthesia Type:General  Level of Consciousness: awake  Airway and Oxygen Therapy: Patient Spontanous Breathing  Post-op Pain: mild  Post-op Assessment: Post-op Vital signs reviewed  Post-op Vital Signs: Reviewed  Complications: No apparent anesthesia complications

## 2012-09-19 ENCOUNTER — Encounter (INDEPENDENT_AMBULATORY_CARE_PROVIDER_SITE_OTHER): Payer: Self-pay | Admitting: Internal Medicine

## 2012-09-19 ENCOUNTER — Ambulatory Visit (INDEPENDENT_AMBULATORY_CARE_PROVIDER_SITE_OTHER): Payer: No Typology Code available for payment source | Admitting: Internal Medicine

## 2012-09-19 VITALS — BP 114/68 | HR 68 | Temp 97.7°F | Resp 16 | Ht 74.0 in | Wt 266.2 lb

## 2012-09-19 DIAGNOSIS — K810 Acute cholecystitis: Secondary | ICD-10-CM

## 2012-09-19 NOTE — Patient Instructions (Signed)
May resume regular activity without restrictions. Follow up as needed. Call with questions or concerns.  

## 2012-09-19 NOTE — Progress Notes (Signed)
  Subjective: Pt returns to the clinic today after undergoing laparoscopic cholecystectomy on 09/03/12 by Dr. Janee Morn.  The patient unfortunately had a similar symptom occur again several days ago.  This resolved quickly after taking nexium.  Bowel function is good.  No problems with the wounds.  Objective: Vital signs in last 24 hours: Reviewed  PE: Abd: soft, non-tender, +bs, incisions well healed  Lab Results:  No results found for this basename: WBC, HGB, HCT, PLT,  in the last 72 hours BMET No results found for this basename: NA, K, CL, CO2, GLUCOSE, BUN, CREATININE, CALCIUM,  in the last 72 hours PT/INR No results found for this basename: LABPROT, INR,  in the last 72 hours CMP     Component Value Date/Time   NA 139 09/03/2012 0526   K 4.0 09/03/2012 0526   CL 101 09/03/2012 0526   CO2 31 09/03/2012 0526   GLUCOSE 111* 09/03/2012 0526   BUN 7 09/03/2012 0526   CREATININE 1.08 09/03/2012 0526   CALCIUM 9.8 09/03/2012 0526   PROT 7.0 09/03/2012 0526   ALBUMIN 4.1 09/03/2012 0526   AST 53* 09/03/2012 0526   ALT 98* 09/03/2012 0526   ALKPHOS 65 09/03/2012 0526   BILITOT 0.5 09/03/2012 0526   GFRNONAA 81* 09/03/2012 0526   GFRAA >90 09/03/2012 0526   Lipase     Component Value Date/Time   LIPASE 24 09/03/2012 0526       Studies/Results: No results found.  Anti-infectives: Anti-infectives   None       Assessment/Plan  1.  S/P Laparoscopic Cholecystectomy: has had one episode of similar symptoms although mild with relief with Nexium.  I suggested continuing Nexium and watching for recurrence of symptoms.  If it recurs he needs to see a GI MD.  May resume regular activity without restrictions, Pt will follow up with [place] PRN and knows to call with questions or concerns.     WHITE, ELIZABETH 09/19/2012

## 2012-10-03 ENCOUNTER — Telehealth (INDEPENDENT_AMBULATORY_CARE_PROVIDER_SITE_OTHER): Payer: Self-pay

## 2012-10-03 NOTE — Telephone Encounter (Signed)
Patient updated that it does not sound like his insurance company is going to approve the Nexium so again patient encouraged to try the Prilosec OTC or he can buy the Nexium OTC in 4 days.  Patient states understanding and agreeable at this time.

## 2012-10-03 NOTE — Telephone Encounter (Signed)
Received pre-authorization form from [facility].  Patient has active prescription from [place] PA from our office for Nexium however because Nexium is due to go OTC in 4 days patients insurance company is not wanting to come prescription.  Will attempt to get authorization however [facility]ist at [facility] states she doesn't think we will be able too.  Spoke again to [place] PA who suggested patient take Prilosec OTC instead which is less expensive and she feels will work just as well as the Nexium.

## 2012-10-03 NOTE — Telephone Encounter (Signed)
Wife states he Is running out of nexium and wants a refill . I expressed he would need to go to his PCP for this. She insist that DR. Thompson to refill Nexium it is the only medication that helps with his pain. Please advise

## 2012-10-03 NOTE — Telephone Encounter (Signed)
Again spoke to [employer] who stated that patient has to try Prevacid OTC, Prilosec OTC, and pantoprazole each for 30 days with documented failure prior to them authorizing Nexium prescription.  Patient made aware at this time and states he will think about what to do next and let us know if he needs [place] any further.

## 2012-10-03 NOTE — Telephone Encounter (Signed)
I will forward the message to Dr Janee Morn to approve

## 2012-12-21 ENCOUNTER — Other Ambulatory Visit (INDEPENDENT_AMBULATORY_CARE_PROVIDER_SITE_OTHER): Payer: Self-pay | Admitting: Orthopedic Surgery

## 2012-12-21 ENCOUNTER — Telehealth (INDEPENDENT_AMBULATORY_CARE_PROVIDER_SITE_OTHER): Payer: Self-pay | Admitting: *Deleted

## 2012-12-21 NOTE — Telephone Encounter (Signed)
Received pharmacy request for Ibuprofen for patient.  Spoke to Sylva PA who approved the Ibuprofen 800mg  #60 at this time which was escribed back to pharmacy.  Marisue Ivan PA stated she will approve it this time however any future requests need to go to patient's PCP.

## 2012-12-21 NOTE — Telephone Encounter (Signed)
Will need to contact PCP in the future for this request but will give one more refill per Marisue Ivan PA

## 2013-01-04 ENCOUNTER — Encounter: Payer: Self-pay | Admitting: Internal Medicine

## 2013-02-01 ENCOUNTER — Other Ambulatory Visit: Payer: Self-pay | Admitting: Neurosurgery

## 2013-02-01 ENCOUNTER — Ambulatory Visit
Admission: RE | Admit: 2013-02-01 | Discharge: 2013-02-01 | Disposition: A | Discharge status: Home / Self Care | Payer: PRIVATE HEALTH INSURANCE | Source: Ambulatory Visit | Attending: Neurosurgery | Admitting: Neurosurgery

## 2013-02-01 DIAGNOSIS — M5412 Radiculopathy, cervical region: Secondary | ICD-10-CM

## 2013-02-07 ENCOUNTER — Other Ambulatory Visit: Payer: Self-pay | Admitting: Neurosurgery

## 2013-02-07 ENCOUNTER — Encounter (HOSPITAL_COMMUNITY): Payer: Self-pay | Admitting: Pharmacy Technician

## 2013-02-09 ENCOUNTER — Encounter (HOSPITAL_COMMUNITY)
Admission: RE | Admit: 2013-02-09 | Discharge: 2013-02-09 | Disposition: A | Discharge status: Home / Self Care | Payer: No Typology Code available for payment source | Source: Ambulatory Visit | Attending: Neurosurgery | Admitting: Neurosurgery

## 2013-02-09 ENCOUNTER — Encounter (HOSPITAL_COMMUNITY): Payer: Self-pay

## 2013-02-09 DIAGNOSIS — Z01818 Encounter for other preprocedural examination: Secondary | ICD-10-CM | POA: Diagnosis present

## 2013-02-09 DIAGNOSIS — Z01812 Encounter for preprocedural laboratory examination: Secondary | ICD-10-CM | POA: Diagnosis not present

## 2013-02-09 HISTORY — DX: Acute embolism and thrombosis of unspecified veins of unspecified upper extremity: I82.609

## 2013-02-09 LAB — CBC WITH DIFFERENTIAL/PLATELET
Basophils Absolute: 0.1 10*3/uL (ref 0.0–0.1)
Eosinophils Relative: 3 % (ref 0–5)
Hemoglobin: 14.1 g/dL (ref 13.0–17.0)
Lymphs Abs: 1.3 10*3/uL (ref 0.7–4.0)
Monocytes Relative: 6 % (ref 3–12)
Neutro Abs: 4.2 10*3/uL (ref 1.7–7.7)
Neutrophils Relative %: 69 % (ref 43–77)
RBC: 4.07 MIL/uL — ABNORMAL LOW (ref 4.22–5.81)

## 2013-02-09 LAB — BASIC METABOLIC PANEL
Chloride: 103 mEq/L (ref 96–112)
GFR calc Af Amer: >90 mL/min (ref >90)
GFR calc non Af Amer: >90 mL/min (ref >90)
Glucose, Bld: 132 mg/dL — ABNORMAL HIGH (ref 70–99)
Potassium: 3.6 mEq/L (ref 3.5–5.1)
Sodium: 138 mEq/L (ref 135–145)

## 2013-02-09 LAB — SURGICAL PCR SCREEN: Staphylococcus aureus: NEGATIVE

## 2013-02-09 NOTE — Pre-Procedure Instructions (Signed)
Kenneth Mckee  02/09/2013   Your procedure is scheduled on:  Tuesday February 13, 2013.  Report to [facility] Short Stay Entrance "A" at 8:20 AM.  Call this number if you have problems the morning of surgery: [phone]   Remember:   Do not eat food or drink liquids after midnight.   Take these medicines the morning of surgery with A SIP OF WATER: Esomeprazole (Nexium), Hydrocodone if needed for pain, Ranitidine (Zantac)   Do not wear jewelry.  Do not wear lotions, powders, or colognes. You may wear deodorant.  Men may shave face and neck.  Do not bring valuables to the hospital.  [facility] is not responsible for any belongings or valuables.               Contacts, dentures or bridgework may not be worn into surgery.  Leave suitcase in the car. After surgery it may be brought to your room.  For patients admitted to the hospital, discharge time is determined by your treatment team.               Patients discharged the day of surgery will not be allowed to drive home.  Name and phone number of your driver: Family/Friend  Special Instructions: Shower using CHG 2 nights before surgery and the night before surgery.  If you shower the day of surgery use CHG.  Use special wash - you have one bottle of CHG for all showers.  You should use approximately 1/3 of the bottle for each shower.   Please read over the following fact sheets that you were given: Pain Booklet, Coughing and Deep Breathing, MRSA Information and Surgical Site Infection Prevention

## 2013-02-09 NOTE — Progress Notes (Signed)
Patient denied having a stress test or cardiac cath, but informed Nurse that he has sleep apnea and wears a Bipap at bedtime. Patient also requested that IV be placed in the left arm for surgery since he had a prior blood clot in his right arm. Note placed on front of patients chart.

## 2013-02-09 NOTE — Progress Notes (Signed)
Patient denied having a PCP and sees Dr. Vassie Loll for sleep apnea.

## 2013-02-12 MED ORDER — DEXAMETHASONE SODIUM PHOSPHATE 10 MG/ML IJ SOLN
10.0000 mg | INTRAMUSCULAR | Status: AC
  Administered 2013-02-13: 10 mg via INTRAVENOUS
  Filled 2013-02-12: qty 1

## 2013-02-12 MED ORDER — VANCOMYCIN HCL 10 G IV SOLR
1500.0000 mg | INTRAVENOUS | Status: AC
  Administered 2013-02-13: 1500 mg via INTRAVENOUS
  Filled 2013-02-12: qty 1500

## 2013-02-12 NOTE — Progress Notes (Signed)
Anesthesia Chart Review:  Patient is a 47 year old male scheduled for C3-4, C4-5, C5-6 ACDF on 02/13/13.  History includes obesity, smoking, GERD, OSA, arthritis, tonsillectomy, nasal sinus surgery, cholecystectomy 08/2012, fatty liver by ultrasound 08/2012.  Previous labs indicate a history of thrombocytopenia. PCP is listed as Dr. Kerby Nora.  EKG on 09/03/12 showed SR with fusion complexed, right BBB.  Preoperative labs noted.  PLT coung 109K, up from 88K on 09/03/12.  PLT count called to [place] Noe at Dr. Lindalou Hose office.  His LFT's were mildly elevated in 08/2011 (in the setting of acute cholecystitis).  I'll order HFP for the day of surgery to ensure no significant increase.  Since PLT count is > 100K and does not appears new when compared to 08/2012 labs I would anticipate that he could proceed from an anesthesia standpoint if HFP are reasonable. 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

## 2013-02-13 ENCOUNTER — Inpatient Hospital Stay (HOSPITAL_COMMUNITY): Payer: No Typology Code available for payment source

## 2013-02-13 ENCOUNTER — Inpatient Hospital Stay (HOSPITAL_COMMUNITY)
Admission: RE | Admit: 2013-02-13 | Discharge: 2013-02-14 | DRG: 473 | Disposition: A | Discharge status: Home / Self Care | Payer: No Typology Code available for payment source | Source: Ambulatory Visit | Attending: Neurosurgery | Admitting: Neurosurgery

## 2013-02-13 ENCOUNTER — Encounter (HOSPITAL_COMMUNITY): Payer: Self-pay | Admitting: Vascular Surgery

## 2013-02-13 ENCOUNTER — Encounter (HOSPITAL_COMMUNITY)
Admission: RE | Disposition: A | Discharge status: Home / Self Care | Payer: Self-pay | Source: Ambulatory Visit | Attending: Neurosurgery

## 2013-02-13 ENCOUNTER — Inpatient Hospital Stay (HOSPITAL_COMMUNITY): Payer: No Typology Code available for payment source | Admitting: Anesthesiology

## 2013-02-13 ENCOUNTER — Encounter (HOSPITAL_COMMUNITY): Payer: Self-pay

## 2013-02-13 DIAGNOSIS — F17200 Nicotine dependence, unspecified, uncomplicated: Secondary | ICD-10-CM | POA: Diagnosis present

## 2013-02-13 DIAGNOSIS — Z880 Allergy status to penicillin: Secondary | ICD-10-CM

## 2013-02-13 DIAGNOSIS — Z79899 Other long term (current) drug therapy: Secondary | ICD-10-CM

## 2013-02-13 DIAGNOSIS — Z9089 Acquired absence of other organs: Secondary | ICD-10-CM

## 2013-02-13 DIAGNOSIS — G4730 Sleep apnea, unspecified: Secondary | ICD-10-CM | POA: Diagnosis present

## 2013-02-13 DIAGNOSIS — Z981 Arthrodesis status: Secondary | ICD-10-CM

## 2013-02-13 DIAGNOSIS — K219 Gastro-esophageal reflux disease without esophagitis: Secondary | ICD-10-CM | POA: Diagnosis present

## 2013-02-13 DIAGNOSIS — Z8249 Family history of ischemic heart disease and other diseases of the circulatory system: Secondary | ICD-10-CM

## 2013-02-13 DIAGNOSIS — M4712 Other spondylosis with myelopathy, cervical region: Principal | ICD-10-CM | POA: Diagnosis present

## 2013-02-13 DIAGNOSIS — Z806 Family history of leukemia: Secondary | ICD-10-CM

## 2013-02-13 DIAGNOSIS — M4802 Spinal stenosis, cervical region: Secondary | ICD-10-CM | POA: Diagnosis present

## 2013-02-13 HISTORY — PX: ANTERIOR CERVICAL DECOMP/DISCECTOMY FUSION: SHX1161

## 2013-02-13 LAB — HEPATIC FUNCTION PANEL
ALT: 79 U/L — ABNORMAL HIGH (ref 0–53)
AST: 49 U/L — ABNORMAL HIGH (ref 0–37)
Albumin: 3.9 g/dL (ref 3.5–5.2)
Alkaline Phosphatase: 57 U/L (ref 39–117)
Indirect Bilirubin: 0.5 mg/dL (ref 0.3–0.9)
Total Bilirubin: 0.6 mg/dL (ref 0.3–1.2)

## 2013-02-13 SURGERY — ANTERIOR CERVICAL DECOMPRESSION/DISCECTOMY FUSION 3 LEVELS
Anesthesia: General | Wound class: Clean

## 2013-02-13 MED ORDER — MENTHOL 3 MG MT LOZG: 1.0000 | LOZENGE | OROMUCOSAL | Status: DC | PRN

## 2013-02-13 MED ORDER — FENTANYL CITRATE 0.05 MG/ML IJ SOLN
INTRAMUSCULAR | Status: DC | PRN
  Administered 2013-02-13 (×4): 50 ug via INTRAVENOUS
  Administered 2013-02-13: 200 ug via INTRAVENOUS

## 2013-02-13 MED ORDER — OXYCODONE HCL 5 MG PO TABS
ORAL_TABLET | ORAL | Status: AC
  Filled 2013-02-13: qty 1

## 2013-02-13 MED ORDER — HYDROMORPHONE HCL PF 1 MG/ML IJ SOLN
0.5000 mg | INTRAMUSCULAR | Status: DC | PRN
  Administered 2013-02-13: 1 mg via INTRAVENOUS
  Filled 2013-02-13: qty 1

## 2013-02-13 MED ORDER — NEOSTIGMINE METHYLSULFATE 1 MG/ML IJ SOLN
INTRAMUSCULAR | Status: DC | PRN
  Administered 2013-02-13: 5 mg via INTRAVENOUS

## 2013-02-13 MED ORDER — 0.9 % SODIUM CHLORIDE (POUR BTL) OPTIME
TOPICAL | Status: DC | PRN
  Administered 2013-02-13: 1000 mL

## 2013-02-13 MED ORDER — CYCLOBENZAPRINE HCL 10 MG PO TABS
ORAL_TABLET | ORAL | Status: AC
  Filled 2013-02-13: qty 1

## 2013-02-13 MED ORDER — HYDROMORPHONE HCL PF 1 MG/ML IJ SOLN
INTRAMUSCULAR | Status: AC
  Filled 2013-02-13: qty 1

## 2013-02-13 MED ORDER — OXYCODONE-ACETAMINOPHEN 5-325 MG PO TABS
1.0000 | ORAL_TABLET | ORAL | Status: DC | PRN
  Filled 2013-02-13: qty 2

## 2013-02-13 MED ORDER — LIDOCAINE HCL (CARDIAC) 20 MG/ML IV SOLN
INTRAVENOUS | Status: DC | PRN
  Administered 2013-02-13: 40 mg via INTRAVENOUS

## 2013-02-13 MED ORDER — SODIUM CHLORIDE 0.9 % IJ SOLN
3.0000 mL | Freq: Two times a day (BID) | INTRAMUSCULAR | Status: DC
  Administered 2013-02-13: 3 mL via INTRAVENOUS

## 2013-02-13 MED ORDER — ZOLPIDEM TARTRATE 5 MG PO TABS: 5.0000 mg | ORAL_TABLET | Freq: Every evening | ORAL | Status: DC | PRN

## 2013-02-13 MED ORDER — HYDROMORPHONE HCL PF 1 MG/ML IJ SOLN
0.2500 mg | INTRAMUSCULAR | Status: DC | PRN
  Administered 2013-02-13 (×4): 0.5 mg via INTRAVENOUS

## 2013-02-13 MED ORDER — HYDROCODONE-ACETAMINOPHEN 5-325 MG PO TABS: 1.0000 | ORAL_TABLET | ORAL | Status: DC | PRN

## 2013-02-13 MED ORDER — LACTATED RINGERS IV SOLN
INTRAVENOUS | Status: DC
  Administered 2013-02-13: 10:00:00 via INTRAVENOUS

## 2013-02-13 MED ORDER — GLYCOPYRROLATE 0.2 MG/ML IJ SOLN
INTRAMUSCULAR | Status: DC | PRN
  Administered 2013-02-13: 0.6 mg via INTRAVENOUS

## 2013-02-13 MED ORDER — SODIUM CHLORIDE 0.9 % IJ SOLN: 3.0000 mL | INTRAMUSCULAR | Status: DC | PRN

## 2013-02-13 MED ORDER — VANCOMYCIN HCL 10 G IV SOLR
1250.0000 mg | Freq: Two times a day (BID) | INTRAVENOUS | Status: DC
  Administered 2013-02-13: 1250 mg via INTRAVENOUS
  Filled 2013-02-13 (×3): qty 1250

## 2013-02-13 MED ORDER — MIDAZOLAM HCL 2 MG/2ML IJ SOLN: 0.5000 mg | Freq: Once | INTRAMUSCULAR | Status: DC | PRN

## 2013-02-13 MED ORDER — LACTATED RINGERS IV SOLN
INTRAVENOUS | Status: DC | PRN
  Administered 2013-02-13 (×2): via INTRAVENOUS

## 2013-02-13 MED ORDER — OXYCODONE HCL 5 MG PO TABS
5.0000 mg | ORAL_TABLET | Freq: Once | ORAL | Status: AC | PRN
  Administered 2013-02-13: 5 mg via ORAL

## 2013-02-13 MED ORDER — SENNA 8.6 MG PO TABS
1.0000 | ORAL_TABLET | Freq: Two times a day (BID) | ORAL | Status: DC
  Administered 2013-02-13: 8.6 mg via ORAL
  Filled 2013-02-13 (×3): qty 1

## 2013-02-13 MED ORDER — THROMBIN 20000 UNITS EX SOLR
CUTANEOUS | Status: DC | PRN
  Administered 2013-02-13: 12:00:00 via TOPICAL

## 2013-02-13 MED ORDER — ACETAMINOPHEN 650 MG RE SUPP: 650.0000 mg | RECTAL | Status: DC | PRN

## 2013-02-13 MED ORDER — PHENOL 1.4 % MT LIQD: 1.0000 | OROMUCOSAL | Status: DC | PRN

## 2013-02-13 MED ORDER — GELATIN ABSORBABLE MT POWD
OROMUCOSAL | Status: DC | PRN
  Administered 2013-02-13: 13:00:00 via TOPICAL

## 2013-02-13 MED ORDER — OXYCODONE HCL 5 MG/5ML PO SOLN: 5.0000 mg | Freq: Once | ORAL | Status: AC | PRN

## 2013-02-13 MED ORDER — PROPOFOL 10 MG/ML IV BOLUS
INTRAVENOUS | Status: DC | PRN
  Administered 2013-02-13: 200 mg via INTRAVENOUS

## 2013-02-13 MED ORDER — ONDANSETRON HCL 4 MG/2ML IJ SOLN
4.0000 mg | INTRAMUSCULAR | Status: DC | PRN
  Administered 2013-02-13: 4 mg via INTRAVENOUS
  Filled 2013-02-13: qty 2

## 2013-02-13 MED ORDER — PANTOPRAZOLE SODIUM 40 MG PO TBEC: 80.0000 mg | DELAYED_RELEASE_TABLET | Freq: Every day | ORAL | Status: DC

## 2013-02-13 MED ORDER — FAMOTIDINE 20 MG PO TABS
20.0000 mg | ORAL_TABLET | Freq: Two times a day (BID) | ORAL | Status: DC
  Administered 2013-02-13: 20 mg via ORAL
  Filled 2013-02-13 (×3): qty 1

## 2013-02-13 MED ORDER — MEPERIDINE HCL 25 MG/ML IJ SOLN: 6.2500 mg | INTRAMUSCULAR | Status: DC | PRN

## 2013-02-13 MED ORDER — SUCCINYLCHOLINE CHLORIDE 20 MG/ML IJ SOLN
INTRAMUSCULAR | Status: DC | PRN
  Administered 2013-02-13: 140 mg via INTRAVENOUS

## 2013-02-13 MED ORDER — PROMETHAZINE HCL 25 MG/ML IJ SOLN: 6.2500 mg | INTRAMUSCULAR | Status: DC | PRN

## 2013-02-13 MED ORDER — ACETAMINOPHEN 325 MG PO TABS: 650.0000 mg | ORAL_TABLET | ORAL | Status: DC | PRN

## 2013-02-13 MED ORDER — ALUM & MAG HYDROXIDE-SIMETH 200-200-20 MG/5ML PO SUSP: 30.0000 mL | Freq: Four times a day (QID) | ORAL | Status: DC | PRN

## 2013-02-13 MED ORDER — ONDANSETRON HCL 4 MG/2ML IJ SOLN
INTRAMUSCULAR | Status: DC | PRN
  Administered 2013-02-13: 4 mg via INTRAMUSCULAR

## 2013-02-13 MED ORDER — VECURONIUM BROMIDE 10 MG IV SOLR
INTRAVENOUS | Status: DC | PRN
  Administered 2013-02-13: 2 mg via INTRAVENOUS
  Administered 2013-02-13 (×2): 1 mg via INTRAVENOUS
  Administered 2013-02-13: 2 mg via INTRAVENOUS
  Administered 2013-02-13: 3 mg via INTRAVENOUS
  Administered 2013-02-13: 1 mg via INTRAVENOUS

## 2013-02-13 MED ORDER — SODIUM CHLORIDE 0.9 % IV SOLN
INTRAVENOUS | Status: DC | PRN
  Administered 2013-02-13: 11:00:00 via INTRAVENOUS

## 2013-02-13 MED ORDER — MIDAZOLAM HCL 5 MG/5ML IJ SOLN
INTRAMUSCULAR | Status: DC | PRN
  Administered 2013-02-13: 2 mg via INTRAVENOUS

## 2013-02-13 MED ORDER — CYCLOBENZAPRINE HCL 10 MG PO TABS
10.0000 mg | ORAL_TABLET | Freq: Three times a day (TID) | ORAL | Status: DC | PRN
  Administered 2013-02-13: 10 mg via ORAL

## 2013-02-13 MED ORDER — SODIUM CHLORIDE 0.9 % IV SOLN: 250.0000 mL | INTRAVENOUS | Status: DC

## 2013-02-13 MED ORDER — SODIUM CHLORIDE 0.9 % IR SOLN
Status: DC | PRN
  Administered 2013-02-13: 12:00:00

## 2013-02-13 MED ORDER — ROCURONIUM BROMIDE 100 MG/10ML IV SOLN
INTRAVENOUS | Status: DC | PRN
  Administered 2013-02-13: 10 mg via INTRAVENOUS

## 2013-02-13 SURGICAL SUPPLY — 66 items
BAG DECANTER FOR FLEXI CONT (MISCELLANEOUS) ×2 IMPLANT
BENZOIN TINCTURE PRP APPL 2/3 (GAUZE/BANDAGES/DRESSINGS) ×2 IMPLANT
BRUSH SCRUB EZ PLAIN DRY (MISCELLANEOUS) ×2 IMPLANT
BUR MATCHSTICK NEURO 3.0 LAGG (BURR) ×2 IMPLANT
CANISTER SUCTION 2500CC (MISCELLANEOUS) ×2 IMPLANT
CLOTH BEACON ORANGE TIMEOUT ST (SAFETY) ×2 IMPLANT
CONT SPEC 4OZ CLIKSEAL STRL BL (MISCELLANEOUS) ×2 IMPLANT
DERMABOND ADVANCED (GAUZE/BANDAGES/DRESSINGS) ×1
DERMABOND ADVANCED .7 DNX12 (GAUZE/BANDAGES/DRESSINGS) ×1 IMPLANT
DRAPE C-ARM 42X72 X-RAY (DRAPES) ×4 IMPLANT
DRAPE LAPAROTOMY 100X72 PEDS (DRAPES) ×2 IMPLANT
DRAPE MICROSCOPE LEICA (MISCELLANEOUS) ×2 IMPLANT
DRAPE MICROSCOPE ZEISS OPMI (DRAPES) IMPLANT
DRAPE POUCH INSTRU U-SHP 10X18 (DRAPES) ×2 IMPLANT
DRILL BIT (BIT) ×2 IMPLANT
DURAPREP 6ML APPLICATOR 50/CS (WOUND CARE) ×2 IMPLANT
ELECT COATED BLADE 2.86 ST (ELECTRODE) ×2 IMPLANT
ELECT REM PT RETURN 9FT ADLT (ELECTROSURGICAL) ×2
ELECTRODE REM PT RTRN 9FT ADLT (ELECTROSURGICAL) ×1 IMPLANT
EVACUATOR 1/8 PVC DRAIN (DRAIN) ×2 IMPLANT
GAUZE SPONGE 4X4 16PLY XRAY LF (GAUZE/BANDAGES/DRESSINGS) ×2 IMPLANT
GLOVE BIO SURGEON STRL SZ 6.5 (GLOVE) ×2 IMPLANT
GLOVE BIOGEL PI IND STRL 6.5 (GLOVE) ×1 IMPLANT
GLOVE BIOGEL PI IND STRL 7.5 (GLOVE) ×1 IMPLANT
GLOVE BIOGEL PI IND STRL 8.5 (GLOVE) ×1 IMPLANT
GLOVE BIOGEL PI INDICATOR 6.5 (GLOVE) ×1
GLOVE BIOGEL PI INDICATOR 7.5 (GLOVE) ×1
GLOVE BIOGEL PI INDICATOR 8.5 (GLOVE) ×1
GLOVE ECLIPSE 7.5 STRL STRAW (GLOVE) ×6 IMPLANT
GLOVE ECLIPSE 8.5 STRL (GLOVE) ×4 IMPLANT
GLOVE EXAM NITRILE LRG STRL (GLOVE) IMPLANT
GLOVE EXAM NITRILE MD LF STRL (GLOVE) ×2 IMPLANT
GLOVE EXAM NITRILE XL STR (GLOVE) IMPLANT
GLOVE EXAM NITRILE XS STR PU (GLOVE) IMPLANT
GOWN BRE IMP SLV AUR LG STRL (GOWN DISPOSABLE) ×2 IMPLANT
GOWN BRE IMP SLV AUR XL STRL (GOWN DISPOSABLE) ×4 IMPLANT
GOWN STRL REIN 2XL LVL4 (GOWN DISPOSABLE) ×2 IMPLANT
HEAD HALTER (SOFTGOODS) ×2 IMPLANT
HEMOSTAT POWDER KIT SURGIFOAM (HEMOSTASIS) ×2 IMPLANT
KIT BASIN OR (CUSTOM PROCEDURE TRAY) ×2 IMPLANT
KIT ROOM TURNOVER OR (KITS) ×2 IMPLANT
NEEDLE SPNL 20GX3.5 QUINCKE YW (NEEDLE) ×2 IMPLANT
NS IRRIG 1000ML POUR BTL (IV SOLUTION) ×2 IMPLANT
PACK LAMINECTOMY NEURO (CUSTOM PROCEDURE TRAY) ×2 IMPLANT
PAD ARMBOARD 7.5X6 YLW CONV (MISCELLANEOUS) ×6 IMPLANT
PLATE 3 67.5XLCK NS SPNE CVD (Plate) ×1 IMPLANT
PLATE 3 ATLANTIS TRANS (Plate) ×1 IMPLANT
RASP 3.0MM (RASP) ×2 IMPLANT
RUBBERBAND STERILE (MISCELLANEOUS) ×4 IMPLANT
SCREW ST FIX 4 ATL 3120213 (Screw) ×16 IMPLANT
SPACER BONE CORNERSTONE 7X14 (Orthopedic Implant) ×2 IMPLANT
SPACER BONE CORNERSTONE 8X14 (Orthopedic Implant) ×2 IMPLANT
SPACER BONE CORNERSTONE 9X14 (Block) ×2 IMPLANT
SPONGE GAUZE 4X4 12PLY (GAUZE/BANDAGES/DRESSINGS) ×2 IMPLANT
SPONGE INTESTINAL PEANUT (DISPOSABLE) ×2 IMPLANT
SPONGE SURGIFOAM ABS GEL 100 (HEMOSTASIS) ×2 IMPLANT
STRIP CLOSURE SKIN 1/2X4 (GAUZE/BANDAGES/DRESSINGS) ×2 IMPLANT
SUT PDS AB 5-0 P3 18 (SUTURE) ×2 IMPLANT
SUT VIC AB 3-0 SH 8-18 (SUTURE) ×2 IMPLANT
SYR 20ML ECCENTRIC (SYRINGE) ×2 IMPLANT
TAPE CLOTH 4X10 WHT NS (GAUZE/BANDAGES/DRESSINGS) ×2 IMPLANT
TAPE CLOTH SURG 4X10 WHT LF (GAUZE/BANDAGES/DRESSINGS) ×2 IMPLANT
TOWEL OR 17X24 6PK STRL BLUE (TOWEL DISPOSABLE) ×2 IMPLANT
TOWEL OR 17X26 10 PK STRL BLUE (TOWEL DISPOSABLE) ×2 IMPLANT
TRAP SPECIMEN MUCOUS 40CC (MISCELLANEOUS) ×2 IMPLANT
WATER STERILE IRR 1000ML POUR (IV SOLUTION) ×2 IMPLANT

## 2013-02-13 NOTE — Anesthesia Procedure Notes (Signed)
Procedure Name: Intubation Date/Time: 02/13/2013 11:01 AM Performed by: Romie Minus K Pre-anesthesia Checklist: Patient identified, Emergency Drugs available, Suction available, Patient being monitored and Timeout performed Patient Re-evaluated:Patient Re-evaluated prior to inductionOxygen Delivery Method: Circle system utilized Preoxygenation: Pre-oxygenation with 100% oxygen Intubation Type: IV induction Grade View: Grade I Tube type: Oral Tube size: 7.5 mm Number of attempts: 1 Airway Equipment and Method: Stylet and Video-laryngoscopy Placement Confirmation: positive ETCO2,  CO2 detector and breath sounds checked- equal and bilateral Secured at: 22 cm Tube secured with: Tape Dental Injury: Teeth and Oropharynx as per pre-operative assessment  Difficulty Due To: Difficulty was anticipated, Difficult Airway- due to large tongue and Difficult Airway- due to reduced neck mobility

## 2013-02-13 NOTE — Transfer of Care (Signed)
Immediate Anesthesia Transfer of Care Note  Patient: Kenneth Mckee  Procedure(s) Performed: Procedure(s) with comments: ANTERIOR CERVICAL DECOMPRESSION/DISCECTOMY FUSION Cervical Three-Four, Four-Five, Five-Six  (N/A) - ANTERIOR CERVICAL DECOMPRESSION/DISCECTOMY FUSION 3 LEVELS  Patient Location: PACU  Anesthesia Type:General  Level of Consciousness: awake, oriented and patient cooperative  Airway & Oxygen Therapy: Patient Spontanous Breathing and Patient connected to face mask oxygen  Post-op Assessment: Report given to PACU RN and Post -op Vital signs reviewed and stable  Post vital signs: Reviewed  Complications: No apparent anesthesia complications

## 2013-02-13 NOTE — Op Note (Signed)
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 Status post C6-7 anterior cervical discectomy with anterior plating  Postoperative diagnosis: Same  Procedure Name: C3-4, C4-5, C5-6 anterior cervical discectomy with interbody fusion utilizing structural allograft and anterior plate instrumentation.  Reexploration of C6-7 anterior cervical fusion with partial removal of instrumentation.   Surgeon:Adalberto Metzgar A.Tressia Labrum, M.D.  Asst. Surgeon: Danielle Dess  Anesthesia: General  Indication: The patient is a 47 year old male with neck and left upper trimming pain paresthesias and weakness probably consistent with a C6 radiculopathy. Patient's remotely status post a C6-7 anterior cervical decompression and fusion. Workup demonstrates evidence of a moderately large left paracentral disc herniation with spinal cord compression at C3-4. Patient has marked spondylosis with stenosis at C4-5 and has severe foraminal stenosis on the left at C5-6. The patient has failed conservative management and presents now for three-level anterior cervical decompression. This will require reexploration of his previous anterior cervical fusion and removal of this hardware.  Operative note: After induction of anesthesia, patient positioned prone onto Wilson frame and her purply padded. Anterior cervical region prepped and draped. Extended and held in place with halter traction. Patient's anterior neck was then prepped and draped sterilely. Incision was made overlying the C5 vertebral body. This carried down sharply to the platysma. Platysma then divided vertically and dissection proceeded on the medial border of the sternocleidomastoid muscle and carotid sheath. Trachea and esophagus are mobilized and retracted towards the left. Previously placed anterior plate instrumentation at C6-7 was identified and dissected free. The  plate was disassembled however the screws into the body of C6 and C7 on the left side were stripped and could not be safely removed. At this point I decided to divide the plate between the C6 and C7 screws. I was then able to remove the plate from the remaining screw at C6 and was then subsequently able to remove the screw at C6. The plan is to leave the residual plate into the body of C7 with a single screw attachment. I felt that trying to retrieve this last screw would likely result in more injury than benefit. Longus colli muscles were then elevated bilaterally from C3-C6. Deep self-retaining traction was placed. The spaces at C3-4, C4-5, C5-6 were then incised a 15 blade in a rectangular fashion. Wide disc space cleanout was then achieved using pituitary rongeurs for tobacco progress Kerrison rongeurs the high-speed drill. All elements of the disc removed down to the level posterior annulus. Starting first at C5-6 remaining aspects of annulus and osteophytes removed using high-speed drill down to level posterior longitudinal ligament. Posterior lateral and was then elevated and resected piecemeal fashion. Underlying thecal sac was identified. Wide central decompression was then performed by undercutting the bodies of C5 and C6. Decompression MCH are afraid are wide anterior foraminotomies were then performed along the course exiting C6 nerve roots bilaterally. At this point a very thorough decompression achieved. There is no his injury to the thecal sac or nerve roots. Wound is an area solution Gelfoam was placed topically for hemostasis. The procedure then repeated at C4-5 and C3-4 without complication. Findings at C4-5 were that of rather advanced spondylosis worse on the right side at C3-4 the findings were that of a rather large paracentral disc herniation. Wound is then irrigated out solution. Cornerstone allograft wedges were then impacted in place recessed roughly 1 mm from anterior cortical margin at  C3-4, C4-5, C5-6. A 67  mm Atlantis translational anterior cervical plate was then placed over the C3-C6 levels. This is then attached using 13 mm fixed angle screws 2 each at all 4 levels. All 8 screws given a final tightening be solidly within bone. Locking screws engaged at all levels. Final images revealed good position bone graft and hardware proper upper level with normal lamina spine. Wound is then irrigated with and bike solution. A medium Hemovac drain was left in the prevertebral space. Wounds and closed in a typical fashion. Steri-Strips and sterile dressing were applied. There were no apparent complications. Patient tolerated the procedure well and he returns they're covering postop.

## 2013-02-13 NOTE — Brief Op Note (Signed)
02/13/2013  2:06 PM  PATIENT:  Kenneth Mckee  47 y.o. male  PRE-OPERATIVE DIAGNOSIS:  stenosis  POST-OPERATIVE DIAGNOSIS:  stenosis  PROCEDURE:  Procedure(s) with comments: ANTERIOR CERVICAL DECOMPRESSION/DISCECTOMY FUSION Cervical Three-Four, Four-Five, Five-Six  (N/A) - ANTERIOR CERVICAL DECOMPRESSION/DISCECTOMY FUSION 3 LEVELS  SURGEON:  Surgeon(s) and Role:    * Temple Pacini, MD - Primary    * Barnett Abu, MD - Assisting  PHYSICIAN ASSISTANT:   ASSISTANTS:    ANESTHESIA:   general  EBL:  Total I/O In: 1900 [I.V.:1900] Out: 200 [Blood:200]  BLOOD ADMINISTERED:none  DRAINS: (Medium) Hemovact drain(s) in the Prevertebral space with  Suction Open   LOCAL MEDICATIONS USED:  NONE  SPECIMEN:  No Specimen  DISPOSITION OF SPECIMEN:    COUNTS:  YES  TOURNIQUET:  * No tourniquets in log *  DICTATION: .Dragon Dictation  PLAN OF CARE: Admit to inpatient   PATIENT DISPOSITION:  PACU - hemodynamically stable.   Delay start of Pharmacological VTE agent (>24hrs) due to surgical blood loss or risk of bleeding: yes

## 2013-02-13 NOTE — Anesthesia Postprocedure Evaluation (Signed)
  Anesthesia Post-op Note  Patient: Kenneth Mckee  Procedure(s) Performed: Procedure(s) with comments: ANTERIOR CERVICAL DECOMPRESSION/DISCECTOMY FUSION Cervical Three-Four, Four-Five, Five-Six  (N/A) - ANTERIOR CERVICAL DECOMPRESSION/DISCECTOMY FUSION 3 LEVELS  Patient Location: PACU  Anesthesia Type:General  Level of Consciousness: awake, alert , oriented and patient cooperative  Airway and Oxygen Therapy: Patient Spontanous Breathing and Patient connected to nasal cannula oxygen  Post-op Pain: mild  Post-op Assessment: Post-op Vital signs reviewed, Patient's Cardiovascular Status Stable, Respiratory Function Stable, Patent Airway, No signs of Nausea or vomiting and Pain level controlled  Post-op Vital Signs: Reviewed and stable  Complications: No apparent anesthesia complications

## 2013-02-13 NOTE — H&P (Signed)
Kenneth Mckee is an 47 y.o. male.   Chief Complaint: Neck and left arm pain and numbness HPI: 47 year old male with left-sided neck pain with radiation to his left upper trimming with a resultant sensory loss and some weakness. Patient's failed conservative management. He is status post previous C6-7 anterior cervical discectomy and fusion remotely in the past. This pain radiates mostly in a pattern typical of a left-sided C6 nerve root distribution. Patient had a CT scan of the cervical spine which demonstrates a worsening left paracentral disc herniation with spinal cord compression at C3-4. Moderately severe spondylosis with stenosis at C4-5 somewhat worse on the right. And foraminal stenosis on the left at C5-6. Fusion appears solid at C6-7. Patient's been casted as to his options. He said proceed with a C3-4, C4-5, C5-6 anterior cervical discectomy and fusion with allograft and anterior plating in hopes of improving his symptoms.  Past Medical History  Diagnosis Date  . Chickenpox   . Arthritis   . 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 Was on coumadin for 6 months in 2004  . GERD (gastroesophageal reflux disease)   . Sleep apnea 2004-2006    sleep study (severe)' wears BiPap nightly  . Umbilical hernia   . Arm vein blood clot     right arm    Past Surgical History  Procedure Laterality Date  . Cervical discectomy  2005  . Tonsillectomy    . Nasal sinus surgery    . Hernia repair  2013    epigastric hernia repair w/mesh  . Cholecystectomy N/A 09/03/2012    Procedure: LAPAROSCOPIC CHOLECYSTECTOMY WITH INTRAOPERATIVE CHOLANGIOGRAM;  Surgeon: Liz Malady, MD;  Location: [facility] OR;  Service: General;  Laterality: N/A;  . Upper gi endoscopy      Family History  Problem Relation Age of Onset  . Heart attack Father 93  . Leukemia Paternal Grandfather    Social History:  reports that he has been smoking Cigarettes.  He has a 25 pack-year  smoking history. He does not have any smokeless tobacco history on file. He reports that he does not drink alcohol or use illicit drugs.  Allergies:  Allergies  Allergen Reactions  . Penicillins Other (See Comments)    From childhood    Medications Prior to Admission  Medication Sig Dispense Refill  . esomeprazole (NEXIUM) 20 MG capsule Take 20 mg by mouth daily before breakfast.      . HYDROcodone-acetaminophen (NORCO/VICODIN) 5-325 MG per tablet Take 1 tablet by mouth every 6 (six) hours as needed for pain.      . ranitidine (ZANTAC) 150 MG tablet Take 150 mg by mouth daily with breakfast.        Results for orders placed during the hospital encounter of 02/13/13 (from the past 48 hour(s))  HEPATIC FUNCTION PANEL     Status: Abnormal   Collection Time    02/13/13  9:02 AM      Result Value Range   Total Protein 6.4  6.0 - 8.3 g/dL   Albumin 3.9  3.5 - 5.2 g/dL   AST 49 (*) 0 - 37 U/L   ALT 79 (*) 0 - 53 U/L   Alkaline Phosphatase 57  39 - 117 U/L   Total Bilirubin 0.6  0.3 - 1.2 mg/dL   Bilirubin, Direct 0.1  0.0 - 0.3 mg/dL   Indirect Bilirubin 0.5  0.3 - 0.9 mg/dL   No results  found.  Review of Systems  Constitutional: Negative.   HENT: Negative.   Eyes: Negative.   Respiratory: Negative.   Cardiovascular: Negative.   Gastrointestinal: Negative.   Genitourinary: Negative.   Musculoskeletal: Negative.   Skin: Negative.   Neurological: Negative.   Endo/Heme/Allergies: Negative.   Psychiatric/Behavioral: Negative.     Blood pressure 134/80, pulse 89, temperature 97 F (36.1 C), temperature source Oral, resp. rate 20, SpO2 97.00%. Physical Exam  Constitutional: He is oriented to person, place, and time. He appears well-developed and well-nourished. No distress.  HENT:  Head: Normocephalic and atraumatic.  Right Ear: External ear normal.  Left Ear: External ear normal.  Nose: Nose normal.  Mouth/Throat: Oropharynx is clear and moist. No oropharyngeal exudate.   Eyes: Conjunctivae and EOM are normal. Pupils are equal, round, and reactive to light. Right eye exhibits no discharge. Left eye exhibits no discharge.  Neck: Normal range of motion. Neck supple. No tracheal deviation present. No thyromegaly present.  Cardiovascular: Normal rate, regular rhythm and intact distal pulses.  Exam reveals no friction rub.   No murmur heard. Respiratory: Effort normal and breath sounds normal. No respiratory distress. He has no wheezes.  GI: Soft. Bowel sounds are normal. He exhibits no distension. There is no tenderness.  Musculoskeletal: Normal range of motion. He exhibits no edema and no tenderness.  Neurological: He is alert and oriented to person, place, and time. He has normal reflexes. He displays normal reflexes. No cranial nerve deficit. He exhibits normal muscle tone. Coordination normal.  Skin: Skin is warm and dry. No rash noted. He is not diaphoretic. No erythema. No pallor.  Psychiatric: He has a normal mood and affect. His behavior is normal. Judgment and thought content normal.     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 Plan C3-4, C4-5, C5-6 anterior cervical discectomy and fusion with allograft and anterior plating. This will require reexploration and C6-7 removal of anterior plate hardware. Risks and benefits been explained. Patient wishes to proceed.  Avalene Sealy A 02/13/2013, 10:30 AM

## 2013-02-13 NOTE — Progress Notes (Signed)
ANTIBIOTIC CONSULT NOTE - INITIAL  Pharmacy Consult for Vancomycin Indication: Surgical prophylaxis while drain remains in place  Allergies  Allergen Reactions  . Penicillins Other (See Comments)    From childhood    Patient Measurements: Height: 6' 0.83" (185 cm) Weight: 275 lb 12.7 oz (125.1 kg) IBW/kg (Calculated) : 79.52  Vital Signs: Temp: 98 F (36.7 C) (10/07 1519) Temp src: Oral (10/07 0900) BP: 145/91 mmHg (10/07 1519) Pulse Rate: 89 (10/07 1519) Intake/Output from previous day:   Intake/Output from this shift: Total I/O In: 2100 [I.V.:2100] Out: 225 [Drains:25; Blood:200]  Labs: No results found for this basename: WBC, HGB, PLT, LABCREA, CREATININE,  in the last 72 hours Estimated Creatinine Clearance: 143.3 ml/min (by C-G formula based on Cr of 0.89). No results found for this basename: VANCOTROUGH, Leodis Binet, VANCORANDOM, GENTTROUGH, GENTPEAK, GENTRANDOM, TOBRATROUGH, TOBRAPEAK, TOBRARND, AMIKACINPEAK, AMIKACINTROU, AMIKACIN,  in the last 72 hours   Microbiology: Recent Results (from the past 720 hour(s))  SURGICAL PCR SCREEN     Status: None   Collection Time    02/09/13 10:49 AM      Result Value Range Status   MRSA, PCR NEGATIVE  NEGATIVE Final   Staphylococcus aureus NEGATIVE  NEGATIVE Final   Comment:            The Xpert SA Assay (FDA     approved for NASAL specimens     in patients over 89 years of age),     is one component of     a comprehensive surveillance     program.  Test performance has     been validated by [employer] for patients greater     than or equal to 43 year old.     It is not intended     to diagnose infection nor to     guide or monitor treatment.    Medical History: Past Medical History  Diagnosis Date  . Chickenpox   . Arthritis   . 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 Was on coumadin for 6 months in 2004  . GERD (gastroesophageal reflux disease)   . Sleep  apnea 2004-2006    sleep study (severe)' wears BiPap nightly  . Umbilical hernia   . Arm vein blood clot     right arm    Assessment: 47 y.o. M who underwent anterior cervical decompression/discectomy fusion on 02/13/13 with a medium Hemovac drain noted to be left in the prevertebral space. Pharmacy was consulted to start Vancomycin for post-op prophylaxis since a drain remains in place. Baseline labs on 02/09/13 show SCr 0.89, estimated CrCl~90-100 ml/min. The patient is noted to have already received a dose of Vancomycin 1500 mg pre-op today at 1100.   Goal of Therapy:  Vancomycin trough level 10-15 mcg/ml  Plan:  1. Vancomycin 1250 mg IV every 12 hours 2. Will obtain SCr in the a.m to evaluate renal function 3. Will continue to follow renal function, culture results, LOT, and antibiotic de-escalation plans   Georgina Pillion, PharmD, BCPS Clinical Pharmacist Pager: [phone] 02/13/2013 4:03 PM

## 2013-02-13 NOTE — Anesthesia Preprocedure Evaluation (Addendum)
Anesthesia Evaluation  Patient identified by MRN, date of birth, ID band Patient awake    Reviewed: Allergy & Precautions, H&P , NPO status , Patient's Chart, lab work & pertinent test results  History of Anesthesia Complications Negative for: history of anesthetic complications  Airway Mallampati: I TM Distance: >3 FB Neck ROM: Full    Dental  (+) Teeth Intact and Dental Advisory Given   Pulmonary sleep apnea (BiPap) , Current Smoker,  breath sounds clear to auscultation  Pulmonary exam normal       Cardiovascular negative cardio ROS  Rhythm:Regular Rate:Normal     Neuro/Psych Chronic neck pain: narcotics    GI/Hepatic Neg liver ROS, GERD-  Medicated and Controlled,  Endo/Other  Morbid obesity  Renal/GU negative Renal ROS     Musculoskeletal   Abdominal (+) + obese,   Peds  Hematology negative hematology ROS (+)   Anesthesia Other Findings   Reproductive/Obstetrics                         Anesthesia Physical Anesthesia Plan  ASA: III  Anesthesia Plan:    Post-op Pain Management:    Induction: Intravenous  Airway Management Planned: Video Laryngoscope Planned and Oral ETT  Additional Equipment:   Intra-op Plan:   Post-operative Plan: Extubation in OR  Informed Consent: I have reviewed the patients History and Physical, chart, labs and discussed the procedure including the risks, benefits and alternatives for the proposed anesthesia with the patient or authorized representative who has indicated his/her understanding and acceptance.     Plan Discussed with: Surgeon and CRNA  Anesthesia Plan Comments: (Plan routine monitors, GETA with Videoglide intubation)        Anesthesia Quick Evaluation

## 2013-02-13 NOTE — Preoperative (Signed)
Beta Blockers   Reason not to administer Beta Blockers:Not Applicable 

## 2013-02-14 LAB — CREATININE, SERUM: Creatinine, Ser: 0.91 mg/dL (ref 0.50–1.35)

## 2013-02-14 MED ORDER — HYDROCODONE-ACETAMINOPHEN 5-325 MG PO TABS: 1.0000 | ORAL_TABLET | Freq: Four times a day (QID) | ORAL | Status: DC | PRN

## 2013-02-14 MED ORDER — CYCLOBENZAPRINE HCL 10 MG PO TABS: 10.0000 mg | ORAL_TABLET | Freq: Three times a day (TID) | ORAL | Status: DC | PRN

## 2013-02-14 NOTE — Progress Notes (Signed)
Pt given D/C instructions with Rx's, verbal understanding was given. All questions were answered prior to D/C. Pt D/C'd home with wife via wheelchair @ 1040 per MD order. Rema Fendt, RN

## 2013-02-14 NOTE — Progress Notes (Signed)
Pt complained of pain several times over the night but refused to take paIN MED, SAID IT WOULD KNOCK HIM OUT and stop him from breathing.

## 2013-02-14 NOTE — Discharge Summary (Signed)
Physician Discharge Summary  Patient ID: Kenneth Mckee MRN: [id number] DOB/AGE: Jan 09, 1966 47 y.o.  Admit date: 02/13/2013 Discharge date: 02/14/2013  Admission Diagnoses:  Discharge Diagnoses:  Principal Problem:   Spinal stenosis in cervical region   Discharged Condition: good  Hospital Course: Patient in the hospital where he underwent uncomplicated three-level anterior cervical decompression and fusion. Postoperatively he is doing quite well. Preoperative neck and upper extremity symptoms are resolved. Strength and sensation are intact. Patient up ambulating without difficulty and ready for discharge home.  Consults:   Significant Diagnostic Studies:   Treatments:   Discharge Exam: Blood pressure 131/75, pulse 96, temperature 98.5 F (36.9 C), temperature source Oral, resp. rate 18, height 6' 0.84" (1.85 m), weight 125.1 kg (275 lb 12.7 oz), SpO2 92.00%. Awake and alert. Oriented and appropriate. Cranial nerve function intact. Motor and sensory function extremities normal. Wound clean and dry. Chest and abdomen benign. Next soft. Breathing easily.  Disposition: 01-Home or Self Care     Medication List         cyclobenzaprine 10 MG tablet  Commonly known as:  FLEXERIL  Take 1 tablet (10 mg total) by mouth 3 (three) times daily as needed for muscle spasms.     esomeprazole 20 MG capsule  Commonly known as:  NEXIUM  Take 20 mg by mouth daily before breakfast.     HYDROcodone-acetaminophen 5-325 MG per tablet  Commonly known as:  NORCO/VICODIN  Take 1-2 tablets by mouth every 6 (six) hours as needed for pain.     ranitidine 150 MG tablet  Commonly known as:  ZANTAC  Take 150 mg by mouth daily with breakfast.           Follow-up Information   Call Temple Pacini, MD.   Specialty:  Neurosurgery   Contact information:   [address] [place] [address] [phone]       Signed: [name] A 02/14/2013, 10:10 AM

## 2013-02-15 ENCOUNTER — Encounter (HOSPITAL_COMMUNITY): Payer: Self-pay | Admitting: Neurosurgery

## 2013-06-11 ENCOUNTER — Telehealth: Payer: Self-pay | Admitting: Pulmonary Disease

## 2013-06-11 DIAGNOSIS — G4733 Obstructive sleep apnea (adult) (pediatric): Secondary | ICD-10-CM

## 2013-06-11 NOTE — Telephone Encounter (Signed)
I have not seen him in a long time Pl obtain download prior to visit & last note etc

## 2013-06-11 NOTE — Telephone Encounter (Signed)
[facility]

## 2013-06-11 NOTE — Telephone Encounter (Signed)
Called and spoke with pt. He requested an appt to see RA today. Advised him he is not in the office. He is scheduled to come in tomorrow in [place]. He reports he has been waking up in panic attacks at night bc he has this feeling he can't breathe at night with the BIPAP on. He reports he is scaring his wife, walking the floors at night bc he can't sleep. Pt called back in feb stating he needed his BIPAP adjusted but never showed up for his appt we scheduled for him. Pt reports he does not want to go one more day without sleep. He isn;t able to work d/t this. Please advise any recs for pt RA? thanks

## 2013-06-12 ENCOUNTER — Ambulatory Visit (INDEPENDENT_AMBULATORY_CARE_PROVIDER_SITE_OTHER): Payer: No Typology Code available for payment source | Admitting: Pulmonary Disease

## 2013-06-12 ENCOUNTER — Encounter: Payer: Self-pay | Admitting: Pulmonary Disease

## 2013-06-12 VITALS — BP 140/70 | HR 87 | Temp 98.0°F | Ht 74.0 in | Wt 280.4 lb

## 2013-06-12 DIAGNOSIS — G4730 Sleep apnea, unspecified: Secondary | ICD-10-CM

## 2013-06-12 NOTE — Patient Instructions (Signed)
We will change to auto settings Stay on anxiety medications  You have to QUIT smoking Titration study

## 2013-06-12 NOTE — Assessment & Plan Note (Signed)
We will change to auto settings Stay on anxiety medications  You have to QUIT smoking Titration study  Weight loss encouraged, compliance with goal of at least 4-6 hrs every night is the expectation. Advised against medications with sedative side effects Cautioned against driving when sleepy - understanding that sleepiness will vary on a day to day basis

## 2013-06-12 NOTE — Progress Notes (Signed)
   Subjective:    Patient ID: Kenneth Mckee, male    DOB: 12-29-1965, 48 y.o.   MRN: [id number]  HPI  47/M, smoker for FU  of severe obstructive sleep apnea   Underwent PSG in feb 2006, AHI 88/h with nadir desatn of 70%, developed nasal congestion with CPAP, hence biPPA titrated to 14/6 with residual RDI of 57/h. he subsequently underwent UPPP by Dr Ezzard StandingNewman, but symptoms never resolved.  Bedtime 10-11 pm, falling asleep while watching TV prior to bedime, latency 5 mins, 1-2 awakenings, nocturia +, oob 0630 , tired, dry mouth , 4 cups sweet tea  Epworth Sleepiness Score 20/24    pt could not tolerate pressure of 14 due to belly insufflation. He also has post traumatic stress & a fullf ace mask makes him very anxious.   he was sent over to the sleep lab & desensitized with biPAP 8/5 & resmed swift large size pillows, which he tolerated well Download 2012 on  8/5 shows some residual events (17/h )  06/12/2013 Lost to FU x 3y He reports he has been waking up in panic attacks at night bc he has this feeling he can't breathe at night with the BIPAP on. He reports he is scaring his wife, walking the floors at night bc he can't sleep. Pt called back in feb stating he needed his BIPAP adjusted but never showed up for his appt we scheduled for him. Pt reports he does not want to go one more day without sleep. He isn;t able to work d/t this.   Bipap pr was never increased  Download 06/2013 on 8/5 shows residual AHI 33/h He has gained wt & underwent neck surgery -was on pain pills & muscle relaxant -which he stoped Saw PCP - who put him on klonopin & lexapro - he slept well on 0.5 mg last night Unclear if he has PTSD , does have severe claustrophobia Continues to smoke 1ppd  Past Medical History  Diagnosis Date  . Chickenpox   . Arthritis   . 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 Was on coumadin for 6 months in 2004  . GERD (gastroesophageal reflux  disease)   . Sleep apnea 2004-2006    sleep study (severe)' wears BiPap nightly  . Umbilical hernia   . Arm vein blood clot     right arm    Review of Systems neg for any significant sore throat, dysphagia, itching, sneezing, nasal congestion or excess/ purulent secretions, fever, chills, sweats, unintended wt loss, pleuritic or exertional cp, hempoptysis, orthopnea pnd or change in chronic leg swelling. Also denies presyncope, palpitations, heartburn, abdominal pain, nausea, vomiting, diarrhea or change in bowel or urinary habits, dysuria,hematuria, rash, arthralgias, visual complaints, headache, numbness weakness or ataxia.     Objective:   Physical Exam  Gen. Pleasant, obese, in no distress ENT - no lesions, no post nasal drip Neck: No JVD, no thyromegaly, no carotid bruits Lungs: no use of accessory muscles, no dullness to percussion, decreased without rales or rhonchi  Cardiovascular: Rhythm regular, heart sounds  normal, no murmurs or gallops, no peripheral edema Musculoskeletal: No deformities, no cyanosis or clubbing , no tremors        Assessment & Plan:

## 2014-11-18 ENCOUNTER — Encounter: Payer: Self-pay | Admitting: Internal Medicine

## 2014-12-26 ENCOUNTER — Telehealth: Payer: Self-pay | Admitting: Pulmonary Disease

## 2014-12-26 DIAGNOSIS — G4733 Obstructive sleep apnea (adult) (pediatric): Secondary | ICD-10-CM

## 2014-12-26 NOTE — Telephone Encounter (Signed)
lmtcb

## 2014-12-26 NOTE — Telephone Encounter (Signed)
On Auto, average pr  Good usage Needs f/u OV

## 2014-12-27 NOTE — Telephone Encounter (Signed)
Patient notified. Patient will call back to schedule appointment.  Patient says that he had trouble with his mask leaking, so he got a new mask.  He says that his numbers should look better on the next download because he is sleeping much better with the new mask.  Download ordered for 4 weeks. Nothing further needed.

## 2015-01-02 ENCOUNTER — Encounter: Payer: Self-pay | Admitting: Pulmonary Disease

## 2016-09-01 ENCOUNTER — Ambulatory Visit (INDEPENDENT_AMBULATORY_CARE_PROVIDER_SITE_OTHER): Payer: BLUE CROSS/BLUE SHIELD | Admitting: Pulmonary Disease

## 2016-09-01 ENCOUNTER — Encounter: Payer: Self-pay | Admitting: Pulmonary Disease

## 2016-09-01 DIAGNOSIS — G4733 Obstructive sleep apnea (adult) (pediatric): Secondary | ICD-10-CM | POA: Diagnosis not present

## 2016-09-01 NOTE — Progress Notes (Signed)
Subjective:    Patient ID: Kenneth Mckee, male    DOB: 1965-09-05, 51 y.o.   MRN: [id number]  HPI  51 year old smoker presents for management of obstructive sleep apnea  PSG 06/2004 >> AHI 88/h with nadir desatn of 70%, developed nasal congestion with CPAP, hence biPAP titrated to 14/6 with residual RDI of 57/h. he subsequently underwent UPPP by Dr Ezzard Standing, but symptoms never resolved.   He was restarted on BiPAP with nasal pillows 8/5 after desensitization, downloads showed residual events On his last visit, 06/2013 BiPAP was increased to 12/8 and repeat titration was advised but he did not follow through  He now presents after 3 years to reestablish care. He has been getting supplies over the Internet since he cannot afford. He is interested in knowing if PTSD can worsen OSA since [facility]  ESS is 0 He is very compliant with his BiPAP machine. Denies daytime somnolence and fatigue, cannot sleep without it, and takes it with him when he travels. Bedtime is around 10:30 PM, sleep latency is variable, reports several nocturnal awakenings and is out of bed around 6:30 AM feeling rested without dryness of mouth or headaches. BiPAP download on 12/8 shows excellent compliance with residual AHI of 6/hour although residual AHI is slightly higher an individual nights and with mild leak. His download appears to be much improved compared to his prior downloads  There is no history suggestive of cataplexy, sleep paralysis or parasomnias  His weight is unchanged at 280 pounds He has developed back pain and knee issues and is undergoing evaluation at [facility]       Past Medical History:  Diagnosis Date  . Arm vein blood clot    right arm  . Arthritis   . Chickenpox   . GERD (gastroesophageal reflux disease)   . Phlebitis    right wrist artery- so small. Artery so far down  no surgery indicated. Was on coumadin for 6 months in 2004  . Sleep apnea 2004-2006   sleep study (severe)' wears BiPap nightly  . Umbilical hernia     Past Surgical History:  Procedure Laterality Date  . ANTERIOR CERVICAL DECOMP/DISCECTOMY FUSION N/A 02/13/2013   Procedure: ANTERIOR CERVICAL DECOMPRESSION/DISCECTOMY FUSION Cervical Three-Four, Four-Five, Five-Six ;  Surgeon: Temple Pacini, MD;  Location: [facility];  Service: Neurosurgery;  Laterality: N/A;  ANTERIOR CERVICAL DECOMPRESSION/DISCECTOMY FUSION 3 LEVELS  . CERVICAL DISCECTOMY  2005  . CHOLECYSTECTOMY N/A 09/03/2012   Procedure: LAPAROSCOPIC CHOLECYSTECTOMY WITH INTRAOPERATIVE CHOLANGIOGRAM;  Surgeon: Liz Malady, MD;  Location: [facility] OR;  Service: General;  Laterality: N/A;  . HERNIA REPAIR  2013   epigastric hernia repair w/mesh  . NASAL SINUS SURGERY    . TONSILLECTOMY    . UPPER GI ENDOSCOPY       Allergies  Allergen Reactions  . Penicillins Other (See Comments)    From childhood    Social History   Social History  . Marital status: Married    Spouse name: N/A  . Number of children: 1  . Years of education: N/A   Occupational History  . [employer]   Social History Main Topics  . Smoking status: Current Every Day Smoker    Packs/day: 1.00    Years: 25.00    Types: Cigarettes  . Smokeless tobacco: Never Used  . 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 Drug use:  No     Comment: remote marijuana use   . Sexual activity: Not on file   Other Topics Concern  . Not on file   Social History Narrative   Regular exercise - no      Family History  Problem Relation Age of Onset  . Heart attack Father 26  . Leukemia Paternal Grandfather      Review of Systems Constitutional: negative for anorexia, fevers and sweats  Eyes: negative for irritation, redness and visual disturbance  Ears, nose, mouth, throat, and face: negative for earaches, epistaxis, nasal congestion and sore throat  Respiratory: negative for cough, dyspnea on exertion,  sputum and wheezing  Cardiovascular: negative for chest pain, dyspnea, lower extremity edema, orthopnea, palpitations and syncope  Gastrointestinal: negative for abdominal pain, constipation, diarrhea, melena, nausea and vomiting  Genitourinary:negative for dysuria, frequency and hematuria  Hematologic/lymphatic: negative for bleeding, easy bruising and lymphadenopathy  Musculoskeletal:negative for arthralgias, muscle weakness and stiff joints  Neurological: negative for coordination problems, gait problems, headaches and weakness  Endocrine: negative for diabetic symptoms including polydipsia, polyuria and weight loss     Objective:   Physical Exam   Gen. Pleasant, obese, in no distress, normal affect ENT - s/p UPPP, no post nasal drip, class 2 airway Neck: No JVD, no thyromegaly, no carotid bruits Lungs: no use of accessory muscles, no dullness to percussion, decreased without rales or rhonchi  Cardiovascular: Rhythm regular, heart sounds  normal, no murmurs or gallops, no peripheral edema Abdomen: soft and non-tender, no hepatosplenomegaly, BS normal. Musculoskeletal: No deformities, no cyanosis or clubbing Neuro:  alert, non focal, no tremors        Assessment & Plan:

## 2016-09-01 NOTE — Assessment & Plan Note (Signed)
Copy of sleep study will be given to you. [facility] so that she can obtain a new machine and supplies on a regular basis He does have a mild leak on 12/8 and few residual events that would be acceptable, much better compared to his prior downloads  Weight loss encouraged, compliance with goal of at least 4-6 hrs every night is the expectation. Advised against medications with sedative side effects Cautioned against driving when sleepy - understanding that sleepiness will vary on a day to day basis

## 2016-09-01 NOTE — Patient Instructions (Addendum)
Copy of sleep study will be given to you. [facility] so that she can obtain a new machine and supplies on a regular basis

## 2018-04-27 ENCOUNTER — Encounter: Payer: Self-pay | Admitting: Gastroenterology

## 2018-05-15 ENCOUNTER — Telehealth: Payer: Self-pay | Admitting: *Deleted

## 2018-05-15 NOTE — Telephone Encounter (Signed)
Please schedule EGD with his colonoscopy for Barrett's surveillance. An fffice appt is not necessary from my standpoint however I am happy to see him in the office first if he prefers.

## 2018-05-15 NOTE — Telephone Encounter (Signed)
Pt states he may have to cancel the colon [facility]  [facility] --  He will have to have a referral for the EGD perpt- he sees [facility] tomorrow- he will ask about the egd being added. He will call me and let me know the plan.  We will leave the colon as scheduled as this time. May have to have the EGD at a later date- Pt states he would like to have them together.   He

## 2018-05-15 NOTE — Telephone Encounter (Signed)
Dr Russella Dar,  This pt is scheduled to have a colon with you 1-29 Wednesday .  He has never had a colonoscopy.  He did have an EGD with Dr Juanda Chance 06-08-2010 and his path came back + for Barrett's- he had a recall egd for 2014 but never had it done.  He is ONLY scheduled for a colon on 1-29.  Should he also have an EGD with his colon?  Or do you want to see him to discuss, Please advise.  Thanks for your time, Elizebeth Brooking

## 2018-05-15 NOTE — Telephone Encounter (Signed)
He wants to leave the colon as scheduled  And will call us- id he has to he will do them separate- I explained that the EGD is needed due to hx Barrett's- he will find out tomorrow what to do and call me back.   Hilda Lias PV

## 2018-05-29 ENCOUNTER — Telehealth: Payer: Self-pay | Admitting: *Deleted

## 2018-05-29 NOTE — Telephone Encounter (Signed)
error 

## 2018-05-29 NOTE — Telephone Encounter (Signed)
Patient indicates that he is able to get referral from [facility] for EGD for barretts screening. However, he states it will be another week or so before he is able to get it. He is still wanting to do endoscopy and colonoscopy together and indicates he will call us back when he is ready to do so as endo/colon referral is good for 1 year.

## 2018-06-07 ENCOUNTER — Encounter: Payer: BLUE CROSS/BLUE SHIELD | Admitting: Gastroenterology

## 2018-10-10 ENCOUNTER — Ambulatory Visit: Payer: BLUE CROSS/BLUE SHIELD | Admitting: Gastroenterology

## 2019-08-23 ENCOUNTER — Other Ambulatory Visit: Payer: Self-pay

## 2019-08-23 ENCOUNTER — Inpatient Hospital Stay (HOSPITAL_COMMUNITY)
Admission: EM | Admit: 2019-08-23 | Discharge: 2019-08-25 | DRG: 247 | Disposition: A | Discharge status: Home / Self Care | Payer: No Typology Code available for payment source | Attending: Cardiovascular Disease | Admitting: Cardiovascular Disease

## 2019-08-23 ENCOUNTER — Encounter (HOSPITAL_COMMUNITY): Payer: Self-pay | Admitting: Emergency Medicine

## 2019-08-23 ENCOUNTER — Emergency Department (HOSPITAL_COMMUNITY): Payer: No Typology Code available for payment source

## 2019-08-23 DIAGNOSIS — Z79899 Other long term (current) drug therapy: Secondary | ICD-10-CM

## 2019-08-23 DIAGNOSIS — Z8249 Family history of ischemic heart disease and other diseases of the circulatory system: Secondary | ICD-10-CM

## 2019-08-23 DIAGNOSIS — E669 Obesity, unspecified: Secondary | ICD-10-CM | POA: Diagnosis present

## 2019-08-23 DIAGNOSIS — F1011 Alcohol abuse, in remission: Secondary | ICD-10-CM | POA: Diagnosis present

## 2019-08-23 DIAGNOSIS — Z720 Tobacco use: Secondary | ICD-10-CM

## 2019-08-23 DIAGNOSIS — R7303 Prediabetes: Secondary | ICD-10-CM | POA: Diagnosis present

## 2019-08-23 DIAGNOSIS — Z981 Arthrodesis status: Secondary | ICD-10-CM

## 2019-08-23 DIAGNOSIS — Z880 Allergy status to penicillin: Secondary | ICD-10-CM

## 2019-08-23 DIAGNOSIS — E785 Hyperlipidemia, unspecified: Secondary | ICD-10-CM

## 2019-08-23 DIAGNOSIS — I2510 Atherosclerotic heart disease of native coronary artery without angina pectoris: Secondary | ICD-10-CM | POA: Diagnosis present

## 2019-08-23 DIAGNOSIS — Z955 Presence of coronary angioplasty implant and graft: Secondary | ICD-10-CM

## 2019-08-23 DIAGNOSIS — Z9049 Acquired absence of other specified parts of digestive tract: Secondary | ICD-10-CM | POA: Diagnosis not present

## 2019-08-23 DIAGNOSIS — Z20822 Contact with and (suspected) exposure to covid-19: Secondary | ICD-10-CM | POA: Diagnosis present

## 2019-08-23 DIAGNOSIS — R079 Chest pain, unspecified: Secondary | ICD-10-CM | POA: Diagnosis not present

## 2019-08-23 DIAGNOSIS — D696 Thrombocytopenia, unspecified: Secondary | ICD-10-CM

## 2019-08-23 DIAGNOSIS — Z6836 Body mass index (BMI) 36.0-36.9, adult: Secondary | ICD-10-CM | POA: Diagnosis not present

## 2019-08-23 DIAGNOSIS — K760 Fatty (change of) liver, not elsewhere classified: Secondary | ICD-10-CM | POA: Diagnosis present

## 2019-08-23 DIAGNOSIS — K219 Gastro-esophageal reflux disease without esophagitis: Secondary | ICD-10-CM | POA: Diagnosis present

## 2019-08-23 DIAGNOSIS — Z716 Tobacco abuse counseling: Secondary | ICD-10-CM | POA: Diagnosis not present

## 2019-08-23 DIAGNOSIS — K429 Umbilical hernia without obstruction or gangrene: Secondary | ICD-10-CM | POA: Diagnosis present

## 2019-08-23 DIAGNOSIS — I25119 Atherosclerotic heart disease of native coronary artery with unspecified angina pectoris: Secondary | ICD-10-CM | POA: Diagnosis present

## 2019-08-23 DIAGNOSIS — I214 Non-ST elevation (NSTEMI) myocardial infarction: Secondary | ICD-10-CM | POA: Diagnosis present

## 2019-08-23 DIAGNOSIS — F17210 Nicotine dependence, cigarettes, uncomplicated: Secondary | ICD-10-CM | POA: Diagnosis present

## 2019-08-23 DIAGNOSIS — R7401 Elevation of levels of liver transaminase levels: Secondary | ICD-10-CM | POA: Diagnosis present

## 2019-08-23 DIAGNOSIS — R0902 Hypoxemia: Secondary | ICD-10-CM | POA: Diagnosis not present

## 2019-08-23 DIAGNOSIS — Z79891 Long term (current) use of opiate analgesic: Secondary | ICD-10-CM

## 2019-08-23 DIAGNOSIS — Z8672 Personal history of thrombophlebitis: Secondary | ICD-10-CM | POA: Diagnosis not present

## 2019-08-23 DIAGNOSIS — G4733 Obstructive sleep apnea (adult) (pediatric): Secondary | ICD-10-CM | POA: Diagnosis present

## 2019-08-23 HISTORY — DX: Non-ST elevation (NSTEMI) myocardial infarction: I21.4

## 2019-08-23 LAB — COMPREHENSIVE METABOLIC PANEL
ALT: 60 U/L — ABNORMAL HIGH (ref 0–44)
AST: 59 U/L — ABNORMAL HIGH (ref 15–41)
Albumin: 3.7 g/dL (ref 3.5–5.0)
Alkaline Phosphatase: 54 U/L (ref 38–126)
Anion gap: 8 (ref 5–15)
BUN: 5 mg/dL — ABNORMAL LOW (ref 6–20)
CO2: 28 mmol/L (ref 22–32)
Calcium: 8.8 mg/dL — ABNORMAL LOW (ref 8.9–10.3)
Chloride: 105 mmol/L (ref 98–111)
Creatinine, Ser: 1.05 mg/dL (ref 0.61–1.24)
GFR calc Af Amer: >60 mL/min (ref >60)
GFR calc non Af Amer: >60 mL/min (ref >60)
Glucose, Bld: 102 mg/dL — ABNORMAL HIGH (ref 70–99)
Potassium: 3.9 mmol/L (ref 3.5–5.1)
Sodium: 141 mmol/L (ref 135–145)
Total Bilirubin: 1.5 mg/dL — ABNORMAL HIGH (ref 0.3–1.2)
Total Protein: 6.1 g/dL — ABNORMAL LOW (ref 6.5–8.1)

## 2019-08-23 LAB — CBC WITH DIFFERENTIAL/PLATELET
Abs Immature Granulocytes: 0.02 10*3/uL (ref 0.00–0.07)
Basophils Absolute: 0.1 10*3/uL (ref 0.0–0.1)
Basophils Relative: 1 %
Eosinophils Absolute: 0.1 10*3/uL (ref 0.0–0.5)
Eosinophils Relative: 1 %
HCT: 42 % (ref 39.0–52.0)
Hemoglobin: 13.9 g/dL (ref 13.0–17.0)
Immature Granulocytes: 0 %
Lymphocytes Relative: 11 %
Lymphs Abs: 0.9 10*3/uL (ref 0.7–4.0)
MCH: 33.2 pg (ref 26.0–34.0)
MCHC: 33.1 g/dL (ref 30.0–36.0)
MCV: 100.2 fL — ABNORMAL HIGH (ref 80.0–100.0)
Monocytes Absolute: 0.5 10*3/uL (ref 0.1–1.0)
Monocytes Relative: 6 %
Neutro Abs: 6.5 10*3/uL (ref 1.7–7.7)
Neutrophils Relative %: 81 %
Platelets: 76 10*3/uL — ABNORMAL LOW (ref 150–400)
RBC: 4.19 MIL/uL — ABNORMAL LOW (ref 4.22–5.81)
RDW: 14.4 % (ref 11.5–15.5)
WBC: 8 10*3/uL (ref 4.0–10.5)
nRBC: 0 % (ref 0.0–0.2)

## 2019-08-23 LAB — LIPID PANEL
Cholesterol: 128 mg/dL (ref 0–200)
HDL: 22 mg/dL — ABNORMAL LOW (ref >40)
LDL Cholesterol: 83 mg/dL (ref 0–99)
Total CHOL/HDL Ratio: 5.8 RATIO
Triglycerides: 113 mg/dL (ref <150)
VLDL: 23 mg/dL (ref 0–40)

## 2019-08-23 LAB — TROPONIN I (HIGH SENSITIVITY)
Troponin I (High Sensitivity): 192 ng/L (ref <18)
Troponin I (High Sensitivity): 499 ng/L (ref <18)
Troponin I (High Sensitivity): 1119 ng/L (ref <18)

## 2019-08-23 LAB — RESPIRATORY PANEL BY RT PCR (FLU A&B, COVID)
Influenza A by PCR: NEGATIVE
Influenza B by PCR: NEGATIVE
SARS Coronavirus 2 by RT PCR: NEGATIVE

## 2019-08-23 LAB — HEPARIN LEVEL (UNFRACTIONATED): Heparin Unfractionated: 0.1 IU/mL — ABNORMAL LOW (ref 0.30–0.70)

## 2019-08-23 LAB — LIPASE, BLOOD: Lipase: 31 U/L (ref 11–51)

## 2019-08-23 LAB — HEMOGLOBIN A1C
Hgb A1c MFr Bld: 5.6 % (ref 4.8–5.6)
Mean Plasma Glucose: 114.02 mg/dL

## 2019-08-23 LAB — PROTIME-INR
INR: 1.1 (ref 0.8–1.2)
Prothrombin Time: 14.4 seconds (ref 11.4–15.2)

## 2019-08-23 MED ORDER — ZOLPIDEM TARTRATE 5 MG PO TABS: 5.0000 mg | ORAL_TABLET | Freq: Every evening | ORAL | Status: DC | PRN

## 2019-08-23 MED ORDER — SODIUM CHLORIDE 0.9 % WEIGHT BASED INFUSION: 1.0000 mL/kg/h | INTRAVENOUS | Status: DC

## 2019-08-23 MED ORDER — SODIUM CHLORIDE 0.9 % IV SOLN: 250.0000 mL | INTRAVENOUS | Status: DC | PRN

## 2019-08-23 MED ORDER — ONDANSETRON HCL 4 MG/2ML IJ SOLN
4.0000 mg | Freq: Once | INTRAMUSCULAR | Status: AC
  Administered 2019-08-23: 4 mg via INTRAVENOUS
  Filled 2019-08-23: qty 2

## 2019-08-23 MED ORDER — SODIUM CHLORIDE 0.9% FLUSH: 3.0000 mL | Freq: Two times a day (BID) | INTRAVENOUS | Status: DC

## 2019-08-23 MED ORDER — SODIUM CHLORIDE 0.9% FLUSH
3.0000 mL | Freq: Two times a day (BID) | INTRAVENOUS | Status: DC
  Administered 2019-08-24: 3 mL via INTRAVENOUS

## 2019-08-23 MED ORDER — SODIUM CHLORIDE 0.9% FLUSH: 3.0000 mL | INTRAVENOUS | Status: DC | PRN

## 2019-08-23 MED ORDER — ACETAMINOPHEN 325 MG PO TABS: 650.0000 mg | ORAL_TABLET | ORAL | Status: DC | PRN

## 2019-08-23 MED ORDER — HEPARIN (PORCINE) 25000 UT/250ML-% IV SOLN
1550.0000 [IU]/h | INTRAVENOUS | Status: DC
  Administered 2019-08-23: 1300 [IU]/h via INTRAVENOUS
  Filled 2019-08-23: qty 250

## 2019-08-23 MED ORDER — FAMOTIDINE IN NACL 20-0.9 MG/50ML-% IV SOLN
20.0000 mg | Freq: Once | INTRAVENOUS | Status: AC
  Administered 2019-08-23: 20 mg via INTRAVENOUS
  Filled 2019-08-23: qty 50

## 2019-08-23 MED ORDER — ATORVASTATIN CALCIUM 40 MG PO TABS
40.0000 mg | ORAL_TABLET | Freq: Every day | ORAL | Status: DC
  Administered 2019-08-24: 40 mg via ORAL
  Filled 2019-08-23: qty 1

## 2019-08-23 MED ORDER — MORPHINE SULFATE (PF) 4 MG/ML IV SOLN
4.0000 mg | Freq: Once | INTRAVENOUS | Status: AC
  Administered 2019-08-23: 4 mg via INTRAVENOUS
  Filled 2019-08-23: qty 1

## 2019-08-23 MED ORDER — ONDANSETRON HCL 4 MG/2ML IJ SOLN: 4.0000 mg | Freq: Four times a day (QID) | INTRAMUSCULAR | Status: DC | PRN

## 2019-08-23 MED ORDER — ALPRAZOLAM 0.25 MG PO TABS: 0.2500 mg | ORAL_TABLET | Freq: Two times a day (BID) | ORAL | Status: DC | PRN

## 2019-08-23 MED ORDER — METOPROLOL TARTRATE 12.5 MG HALF TABLET
12.5000 mg | ORAL_TABLET | Freq: Two times a day (BID) | ORAL | Status: DC
  Administered 2019-08-23 – 2019-08-25 (×3): 12.5 mg via ORAL
  Filled 2019-08-23 (×3): qty 1

## 2019-08-23 MED ORDER — NITROGLYCERIN 0.4 MG SL SUBL: 0.4000 mg | SUBLINGUAL_TABLET | SUBLINGUAL | Status: DC | PRN

## 2019-08-23 MED ORDER — SODIUM CHLORIDE 0.9 % WEIGHT BASED INFUSION: 3.0000 mL/kg/h | INTRAVENOUS | Status: DC

## 2019-08-23 MED ORDER — ASPIRIN 81 MG PO CHEW
81.0000 mg | CHEWABLE_TABLET | ORAL | Status: AC
  Administered 2019-08-24: 81 mg via ORAL
  Filled 2019-08-23: qty 1

## 2019-08-23 NOTE — Progress Notes (Signed)
ANTICOAGULATION CONSULT NOTE - Initial Consult  Pharmacy Consult for heparin Indication: chest pain/ACS  Allergies  Allergen Reactions  . Penicillins Other (See Comments)    From childhood  . Sumatriptan Other (See Comments)    Dizziness (intolerance)    Patient Measurements: Height: 6\' 2"  (188 cm) Weight: 127.3 kg (280 lb 10.3 oz) IBW/kg (Calculated) : 82.2 Heparin Dosing Weight: 110kg  Vital Signs: Temp: 98.3 F (36.8 C) (04/15 1531) Temp Source: Oral (04/15 1531) BP: 121/72 (04/15 1753) Pulse Rate: 65 (04/15 1753)  Labs: Recent Labs    08/23/19 1632  HGB 13.9  HCT 42.0  PLT 76*  CREATININE 1.05  TROPONINIHS 192*    Estimated Creatinine Clearance: 116.6 mL/min (by C-G formula based on SCr of 1.05 mg/dL).   Medical History: Past Medical History:  Diagnosis Date  . Arm vein blood clot    right arm  . Arthritis   . Chickenpox   . GERD (gastroesophageal reflux disease)   . Phlebitis    right wrist artery- so small. Artery so far down  no surgery indicated. Was on coumadin for 6 months in 2004  . Sleep apnea 2004-2006   sleep study (severe)' wears BiPap nightly  . Umbilical hernia    Assessment: 52 YOM presenting with ongoing CP radiating to arms, diaphoretic.  Not on anticoagulation PTA, H/H wnl, plts low 76 (chronic thrombocytopenia).    Goal of Therapy:  Heparin level 0.3-0.7 units/ml Monitor platelets by anticoagulation protocol: Yes   Plan:  Heparin gtt at 1300 units/hr, no bolus d/t thrombocytopenia F/u 6 hour heparin level F/u cards workup  44, PharmD Clinical Pharmacist ED Pharmacist Phone # [phone] 08/23/2019 6:22 PM

## 2019-08-23 NOTE — ED Triage Notes (Addendum)
Pt bib gems from home for chest pain that has been going on for past x6 weeks. Pt reports pain started after received first covid shot. Pt reports pain 20/10 today. Radiates to both arms and both sides of jaw. Pt was diaphoretic, hx of GERD, says it feels nothing like it. EMS gave 324 ASA, 0.4 Nitro.

## 2019-08-23 NOTE — H&P (Signed)
Cardiology Admission History and Physical:   Patient ID: Kenneth Mckee; MRN: [id number]; DOB: 10/08/1965   Admission date: 08/23/2019  Primary Care Provider: System, Pcp Not In Primary Cardiologist: Evalina Field, MD New Primary Electrophysiologist: None    Chief Complaint:  Chest pain  Patient Profile:   Kenneth Mckee is a 54 y.o. male with a history of severe OSA on BiPAP, GERD, pseudogout, thrombocytopenia E, lumbar spondylosis, sciatica, chronic neck/back pain issues, family history of premature coronary artery disease.  History of Present Illness:   Kenneth Mckee started having exertional chest pain several weeks ago.  He started noticing it first when he was climbing stairs.  The pain is a burning, in the center of his chest.  It is associated with shortness of breath.  He started noticing it happening more often.  The symptoms were always relieved by rest.  He did not have any episodes of chest pain that started at rest.  He would attributed to GERD, too many cigarettes, or other things, but he kept having the exertional pain.  Today, the pain started after he was doing something.  It got worse and worse.  It became greater than a 10/10.  He finally called EMS.  EMS gave him aspirin 324 mg and sublingual nitroglycerin x1.  He feels these medications got him down to a 10/10 level of pain.  In the ER, he has gotten morphine and Zofran.  He also got IV Pepcid.  The medications have decreased his pain down to a 0/10.  The pain has never been as bad as it was today.  His platelets have been low for years.  At one point, he had surgery and required a platelet transfusion first.  He was told that his platelets were low because of fatty liver and an enlarged spleen.  He has not had any bleeding issues.  He continues to smoke a pack a day, but does not drink anything.   Past Medical History:  Diagnosis Date  . Arm vein blood clot    right arm  . Arthritis   . Chickenpox   . GERD  (gastroesophageal reflux disease)   . NSTEMI (non-ST elevated myocardial infarction) ([place]) 08/23/2019  . Phlebitis    right wrist artery- so small. Artery so far down  no surgery indicated. Was on coumadin for 6 months in 2004  . Sleep apnea 2004-2006   sleep study (severe)' wears BiPap nightly  . Umbilical hernia     Past Surgical History:  Procedure Laterality Date  . ANTERIOR CERVICAL DECOMP/DISCECTOMY FUSION N/A 02/13/2013   Procedure: ANTERIOR CERVICAL DECOMPRESSION/DISCECTOMY FUSION Cervical Three-Four, Four-Five, Five-Six ;  Surgeon: Charlie Pitter, MD;  Location: [facility];  Service: Neurosurgery;  Laterality: N/A;  ANTERIOR CERVICAL DECOMPRESSION/DISCECTOMY FUSION 3 LEVELS  . CERVICAL DISCECTOMY  2005  . CHOLECYSTECTOMY N/A 09/03/2012   Procedure: LAPAROSCOPIC CHOLECYSTECTOMY WITH INTRAOPERATIVE CHOLANGIOGRAM;  Surgeon: Zenovia Jarred, MD;  Location: [facility];  Service: General;  Laterality: N/A;  . HERNIA REPAIR  2013   epigastric hernia repair w/mesh  . NASAL SINUS SURGERY    . TONSILLECTOMY    . UPPER GI ENDOSCOPY       Medications Prior to Admission: Prior to Admission medications   Medication Sig Start Date End Date Taking? Authorizing Provider  Benzoyl Peroxide 10 % LIQD Apply 1 application topically daily. Use to wash affected area 04/16/19  Yes [provider]  busPIRone (BUSPAR) 15 MG tablet Take 7.5 mg by mouth  2 (two) times daily. 05/25/19  Yes [provider]  Cholecalciferol 50 MCG (2000 UT) TABS Take 1 tablet by mouth daily. 06/27/19  Yes [provider]  clindamycin (CLEOCIN T) 1 % external solution Apply 1 application topically daily. 04/16/19  Yes [provider]  cyclobenzaprine (FLEXERIL) 10 MG tablet Take 10 mg by mouth as needed for muscle spasms. 05/24/19  Yes [provider]  doxycycline (MONODOX) 100 MG capsule Take 100 mg by mouth as needed (for flare ups).  04/15/19  Yes [provider]  escitalopram  (LEXAPRO) 10 MG tablet Take 10 mg by mouth 3 (three) times daily.  06/11/13  Yes [provider]  gabapentin (NEURONTIN) 300 MG capsule Take 300 mg by mouth as needed for pain.   Yes [provider]  ibuprofen (ADVIL,MOTRIN) 800 MG tablet Take 800 mg by mouth as needed for moderate pain.    Yes [provider]  Meclizine HCl 25 MG CHEW Chew 25 mg by mouth as needed for dizziness. 11/21/18  Yes [provider]  methocarbamol (ROBAXIN) 500 MG tablet Take 500 mg by mouth as needed for muscle spasms. 05/24/19  Yes [provider]  Multiple Vitamins-Iron TABS Take 1 tablet by mouth daily.   Yes [provider]  pantoprazole (PROTONIX) 40 MG tablet Take 40 mg by mouth.   Yes [provider]  promethazine (PHENERGAN) 25 MG tablet Take 25 mg by mouth as needed for nausea. 06/05/19  Yes [provider]  rizatriptan (MAXALT) 5 MG tablet Take 2.5 mg by mouth as needed for migraine. 06/06/19  Yes [provider]     Allergies:    Allergies  Allergen Reactions  . Penicillins Other (See Comments)    From childhood  . Sumatriptan Other (See Comments)    Dizziness (intolerance)    Social History:   Social History   Socioeconomic History  . Marital status: Married    Spouse name: Not on file  . Number of children: 1  . Years of education: Not on file  . Highest education level: Not on file  Occupational History  . Occupation: Statistician: [employer]  Tobacco Use  . Smoking status: Current Every Day Smoker    Packs/day: 1.00    Years: 25.00    Pack years: 25.00    Types: Cigarettes  . Smokeless tobacco: Never Used  Substance and Sexual Activity  . 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 Drug use: No    Comment: remote marijuana use   . Sexual activity: Not on file  Other Topics Concern  . Not on file  Social History Narrative   Regular exercise - no    Social  Determinants of Health   Financial Resource Strain:   . Difficulty of Paying Living Expenses:   Food Insecurity:   . Worried About Programme researcher, broadcasting/film/video in the Last Year:   . Barista in the Last Year:   Transportation Needs:   . Freight forwarder (Medical):   Marland Kitchen Lack of Transportation (Non-Medical):   Physical Activity:   . Days of Exercise per Week:   . Minutes of Exercise per Session:   Stress:   . Feeling of Stress :   Social Connections:   . Frequency of Communication with Friends and Family:   . Frequency of Social Gatherings with Friends and Family:   . Attends Religious Services:   .  Active Member of Clubs or Organizations:   . Attends Banker Meetings:   Marland Kitchen Marital Status:   Intimate Partner Violence:   . Fear of Current or Ex-Partner:   . Emotionally Abused:   Marland Kitchen Physically Abused:   . Sexually Abused:     Family History:  The patient's family history includes Heart attack (age of onset: 52) in his father; Leukemia in his paternal grandfather.   The patient He indicated that his mother is alive. He indicated that his father is alive. He indicated that his paternal grandfather is deceased.  ROS:  Please see the history of present illness.  All other ROS reviewed and negative.     Physical Exam/Data:   Vitals:   08/23/19 1529 08/23/19 1531 08/23/19 1753  BP:  135/76 121/72  Pulse:  63 65  Resp:  17 15  Temp:  98.3 F (36.8 C)   TempSrc:  Oral   SpO2:  100% 99%  Weight: 127.3 kg    Height: 6\' 2"  (1.88 m)     No intake or output data in the 24 hours ending 08/23/19 1917 Filed Weights   08/23/19 1529  Weight: 127.3 kg   Body mass index is 36.03 kg/m.  General:  Well nourished, well developed, male in no acute distress HEENT: normal Lymph: no adenopathy Neck:  JVD not elevated Endocrine:  No thryomegaly Vascular: No carotid bruits; 4/4 extremity pulses 2+ bilaterally  Cardiac:  normal S1, S2; RRR; no murmur, no rub or gallop   Lungs:  clear to auscultation bilaterally, no wheezing, rhonchi or rales  Abd: Obese, soft, nontender, no hepatomegaly  Ext: No edema Musculoskeletal:  No deformities, BUE and BLE strength normal and equal Skin: warm and dry; ?petechial rash  Neuro:  CNs 2-12 intact, no focal abnormalities noted Psych:  Normal affect    EKG:  The ECG was personally reviewed: SR, RBBB is old.  Heart rate 62, no obvious ischemic changes Telemetry: Sinus rhythm  Relevant CV Studies:  None  Laboratory Data:  Chemistry Recent Labs  Lab 08/23/19 1632  NA 141  K 3.9  CL 105  CO2 28  GLUCOSE 102*  BUN 5*  CREATININE 1.05  CALCIUM 8.8*  GFRNONAA >60  GFRAA >60  ANIONGAP 8    Recent Labs  Lab 08/23/19 1632  PROT 6.1*  ALBUMIN 3.7  AST 59*  ALT 60*  ALKPHOS 54  BILITOT 1.5*   Hematology Recent Labs  Lab 08/23/19 1632  WBC 8.0  RBC 4.19*  HGB 13.9  HCT 42.0  MCV 100.2*  MCH 33.2  MCHC 33.1  RDW 14.4  PLT 76*   Cardiac Enzymes  High Sensitivity Troponin:   Recent Labs  Lab 08/23/19 1632  TROPONINIHS 192*     BNPNo results for input(s): BNP, PROBNP in the last 168 hours.  DDimer No results for input(s): DDIMER in the last 168 hours. Lipids:  Lab Results  Component Value Date   CHOL 123 05/28/2010   HDL 24 (L) 05/28/2010   LDLCALC 70 05/28/2010   TRIG 144 05/28/2010   CHOLHDL 5.1 Ratio 05/28/2010   INR: No results found for: INR, PROTIME A1c: No results found for: HGBA1C Thyroid:  Lab Results  Component Value Date   TSH 1.533 05/28/2010    Radiology/Studies:  DG Chest 2 View  Result Date: 08/23/2019 CLINICAL DATA:  Chest pain for 1 day EXAM: CHEST - 2 VIEW COMPARISON:  None. FINDINGS: Cardiac shadow is within normal limits. Lungs are  well aerated bilaterally. No focal infiltrate or sizable effusion is seen. No acute bony abnormality is noted. Postsurgical changes are noted in the cervical spine. IMPRESSION: No acute abnormality noted. Electronically Signed    By: Alcide Clever M.D.   On: 08/23/2019 16:17    Assessment and Plan:   1.  Non-STEMI: - Admit, continue to cycle ez, add heparin (Oncology said ok) -Continue ASA 81 mg -He is currently pain-free, hold off on nitrates unless he has recurrent pain -Cardiac catheterization is indicated. - Cardiac catheterization was discussed with the patient fully. The patient understands that risks include but are not limited to stroke (1 in 1000), death (1 in 1000), kidney failure [usually temporary] (1 in 500), bleeding (1 in 200), allergic reaction [possibly serious] (1 in 200).  The patient understands and is willing to proceed.   -We will screen for cardiac risk factors and their control -Continue home medications with the exception of ibuprofen -Follow liver functions, add high-dose statin  Principal Problem:   NSTEMI (non-ST elevated myocardial infarction) [facility])     For questions or updates, please contact [facility] Please consult [email or web address] for contact info under Cardiology/STEMI.    Melida Quitter, PA-C  08/23/2019 7:17 PM

## 2019-08-23 NOTE — ED Notes (Signed)
Patient transported to XR. 

## 2019-08-23 NOTE — ED Provider Notes (Signed)
[facility] EMERGENCY DEPARTMENT Provider Note   CSN: [id number] Arrival date & time: 08/23/19  1508     History Chief Complaint  Patient presents with  . Chest Pain    Kenneth Mckee is a 54 y.o. male with a history of tobacco abuse, sleep apnea, GERD, hepatic steatosis, thrombocytopenia, and prior cholecystectomy who presents to the emergency department via EMS with complaints of chest pain intermittently for the past 6 weeks that seemed worse today.  Patient states that pain is to the generalized anterior chest, feels like a burning sensation, initially had no associated symptoms or radiation, occurring a couple times per week, usually with exertion, no other alleviating/aggravating factors. Today he had onset of similar pain but this seemed more intense and radiated to his jaw and bilateral proximal upper extremities with associated diaphoresis.  States he was at rest with onset of pain.  He tried eating, moving, different positions, and did not have any relief in his symptoms.  This was not worse with exertion or deep breath today.  He called 911, EMS administered nitroglycerin and 324 mg of aspirin without relief in his symptoms.  He denies dyspnea, nausea, vomiting, lightheadedness, dizziness, syncope, unilateral leg or arm pain/swelling, hemoptysis, recent surgery/trauma, recent long travel, hormone use,or history of cancer.  He reports that he had a prior blood clot in his wrist before, he does not know this was deep or superficial, states he took aspirin for it.  Denies alcohol or drug use.  HPI     Past Medical History:  Diagnosis Date  . Arm vein blood clot    right arm  . Arthritis   . Chickenpox   . GERD (gastroesophageal reflux disease)   . Phlebitis    right wrist artery- so small. Artery so far down  no surgery indicated. Was on coumadin for 6 months in 2004  . Sleep apnea 2004-2006   sleep study (severe)' wears BiPap nightly  . Umbilical hernia      Patient Active Problem List   Diagnosis Date Noted  . Spinal stenosis in cervical region 02/13/2013  . Acute cholecystitis 09/03/2012  . Transaminitis 09/03/2012  . Hernia, epigastric 09/15/2011  . Chickenpox   . Arthritis   . Phlebitis   . OBESITY 06/23/2010  . PREDIABETES 05/29/2010  . LOW HDL 05/28/2010  . ALCOHOL ABUSE, IN REMISSION 05/22/2010  . ARTHRITIS 05/22/2010  . 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 OSA treated with BiPAP 05/22/2010  . EPIGASTRIC PAIN 05/22/2010  . CHICKENPOX, HX OF 05/22/2010    Past Surgical History:  Procedure Laterality Date  . ANTERIOR CERVICAL DECOMP/DISCECTOMY FUSION N/A 02/13/2013   Procedure: ANTERIOR CERVICAL DECOMPRESSION/DISCECTOMY FUSION Cervical Three-Four, Four-Five, Five-Six ;  Surgeon: Temple Pacini, MD;  Location: [facility];  Service: Neurosurgery;  Laterality: N/A;  ANTERIOR CERVICAL DECOMPRESSION/DISCECTOMY FUSION 3 LEVELS  . CERVICAL DISCECTOMY  2005  . CHOLECYSTECTOMY N/A 09/03/2012   Procedure: LAPAROSCOPIC CHOLECYSTECTOMY WITH INTRAOPERATIVE CHOLANGIOGRAM;  Surgeon: Liz Malady, MD;  Location: [facility];  Service: General;  Laterality: N/A;  . HERNIA REPAIR  2013   epigastric hernia repair w/mesh  . NASAL SINUS SURGERY    . TONSILLECTOMY    . UPPER GI ENDOSCOPY         Family History  Problem Relation Age of Onset  . Heart attack Father 53  . Leukemia Paternal Grandfather     Social History   Tobacco Use  . Smoking status: Current Every Day Smoker  Packs/day: 1.00    Years: 25.00    Pack years: 25.00    Types: Cigarettes  . Smokeless tobacco: Never Used  Substance Use Topics  . 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 Drug use: No    Comment: remote marijuana use     Home Medications Prior to Admission medications   Medication Sig Start Date End Date Taking? Authorizing Provider  escitalopram (LEXAPRO) 10 MG tablet 10 mg. 06/11/13   [provider]   esomeprazole (NEXIUM) 20 MG capsule Take 20 mg by mouth daily before breakfast.    [provider]  gabapentin (NEURONTIN) 100 MG capsule Take 400 mg by mouth.    [provider]  ibuprofen (ADVIL,MOTRIN) 800 MG tablet Take 800 mg by mouth.    [provider]  oxyCODONE-acetaminophen (PERCOCET/ROXICET) 5-325 MG tablet Take 1 tablet by mouth.    [provider]  pantoprazole (PROTONIX) 40 MG tablet Take 40 mg by mouth.    [provider]    Allergies    Penicillins  Review of Systems   Review of Systems  Constitutional: Positive for diaphoresis. Negative for chills and fever.  Respiratory: Negative for cough and shortness of breath.   Cardiovascular: Positive for chest pain.  Gastrointestinal: Negative for abdominal pain, blood in stool, constipation, diarrhea, nausea and vomiting.  Neurological: Negative for dizziness, syncope, weakness, light-headedness and numbness.  All other systems reviewed and are negative.   Physical Exam Updated Vital Signs BP 121/72   Pulse 65   Temp 98.3 F (36.8 C) (Oral)   Resp 15   Ht 6\' 2"  (1.88 m)   Wt 127.3 kg   SpO2 99%   BMI 36.03 kg/m   Physical Exam Vitals and nursing note reviewed.  Constitutional:      General: He is not in acute distress.    Appearance: He is well-developed. He is not toxic-appearing.  HENT:     Head: Normocephalic and atraumatic.  Eyes:     General:        Right eye: No discharge.        Left eye: No discharge.     Conjunctiva/sclera: Conjunctivae normal.  Cardiovascular:     Rate and Rhythm: Normal rate and regular rhythm.     Pulses:          Radial pulses are 2+ on the right side and 2+ on the left side.       Posterior tibial pulses are 2+ on the right side and 2+ on the left side.  Pulmonary:     Effort: Pulmonary effort is normal. No respiratory distress.     Breath sounds: Normal breath sounds. No wheezing, rhonchi or rales.  Abdominal:     General:  There is no distension.     Palpations: Abdomen is soft.     Tenderness: There is abdominal tenderness (diffuse upper abdomen). There is no guarding or rebound.  Musculoskeletal:     Cervical back: Neck supple.     Comments: Symmetric trace edema to the bilateral lower extremities. No overlying erythema/warmth. No calf tenderness.   Skin:    General: Skin is warm and dry.     Findings: No rash.  Neurological:     Mental Status: He is alert.     Comments: Clear speech.   Psychiatric:        Behavior: Behavior normal.     ED Results / Procedures / Treatments  Labs (all labs ordered are listed, but only abnormal results are displayed) Labs Reviewed  COMPREHENSIVE METABOLIC PANEL - Abnormal; Notable for the following components:      Result Value   Glucose, Bld 102 (*)    BUN 5 (*)    Calcium 8.8 (*)    Total Protein 6.1 (*)    AST 59 (*)    ALT 60 (*)    Total Bilirubin 1.5 (*)    All other components within normal limits  CBC WITH DIFFERENTIAL/PLATELET - Abnormal; Notable for the following components:   RBC 4.19 (*)    MCV 100.2 (*)    Platelets 76 (*)    All other components within normal limits  TROPONIN I (HIGH SENSITIVITY) - Abnormal; Notable for the following components:   Troponin I (High Sensitivity) 192 (*)    All other components within normal limits  LIPASE, BLOOD  TROPONIN I (HIGH SENSITIVITY)    EKG EKG Interpretation  Date/Time:  Thursday August 23 2019 15:30:03 EDT Ventricular Rate:  62 PR Interval:    QRS Duration: 134 QT Interval:  471 QTC Calculation: 479 R Axis:   -14 Text Interpretation: Sinus rhythm Borderline short PR interval Right bundle branch block since last tracing no significant change Confirmed by Mancel Bale [phone]) on 08/23/2019 3:42:58 PM   Radiology DG Chest 2 View  Result Date: 08/23/2019 CLINICAL DATA:  Chest pain for 1 day EXAM: CHEST - 2 VIEW COMPARISON:  None. FINDINGS: Cardiac shadow is within normal limits. Lungs are  well aerated bilaterally. No focal infiltrate or sizable effusion is seen. No acute bony abnormality is noted. Postsurgical changes are noted in the cervical spine. IMPRESSION: No acute abnormality noted. Electronically Signed   By: Alcide Clever M.D.   On: 08/23/2019 16:17    Procedures .Critical Care Performed by: Cherly Anderson, PA-C Authorized by: Cherly Anderson, PA-C     CRITICAL CARE Performed by: Harvie Heck   Total critical care time: 40 minutes  Critical care time was exclusive of separately billable procedures and treating other patients.  Critical care was necessary to treat or prevent imminent or life-threatening deterioration.  Critical care was time spent personally by me on the following activities: development of treatment plan with patient and/or surrogate as well as nursing, discussions with consultants, evaluation of patient's response to treatment, examination of patient, obtaining history from patient or surrogate, ordering and performing treatments and interventions, ordering and review of laboratory studies, ordering and review of radiographic studies, pulse oximetry and re-evaluation of patient's condition.   (including critical care time)  Medications Ordered in ED Medications  heparin ADULT infusion 100 units/mL (25000 units/267mL sodium chloride 0.45%) (1,300 Units/hr Intravenous New Bag/Given 08/23/19 1917)  famotidine (PEPCID) IVPB 20 mg premix (0 mg Intravenous Stopped 08/23/19 1701)  morphine 4 MG/ML injection 4 mg (4 mg Intravenous Given 08/23/19 1631)  ondansetron (ZOFRAN) injection 4 mg (4 mg Intravenous Given 08/23/19 1631)    ED Course  I have reviewed the triage vital signs and the nursing notes.  Pertinent labs & imaging results that were available during my care of the patient were reviewed by me and considered in my medical decision making (see chart for details).    Kenneth Mckee was evaluated in Emergency Department on  08/23/2019 for the symptoms described in the history of present illness. He/she was evaluated in the context of the global COVID-19 pandemic, which necessitated consideration that the patient might be at risk for infection with  the SARS-CoV-2 virus that causes COVID-19. Institutional protocols and algorithms that pertain to the evaluation of patients at risk for COVID-19 are in a state of rapid change based on information released by regulatory bodies including the CDC and federal and state organizations. These policies and algorithms were followed during the patient's care in the ED.  MDM Rules/Calculators/A&P                      This patient presents to the ED for concern of chest pain intermittently x 6 weeks, worse today prompting ED visit, this involves an extensive number of treatment options, and is a complaint that carries with it a high risk of complications and morbidity.  The differential diagnosis includes ACS, pulmonary embolism, dissection, pneumothorax, pneumonia, critical anemia, arrhythmia, GERD, PUD, MSK, pancreatitis.   Additional history obtained:  Additional history obtained from EMS. Previous records obtained and reviewed.   EKG: NSR, RBBB, similar to prior.   Lab Tests:  I Ordered, reviewed, and interpreted labs, which included:  CBD: No significant anemia or leukocytosis. Thrombocytopenia that is mildly worse but overall similar to prior.  CMP: Mildly elevated LFTs similar to prior. No significant electrolyte derangement.  Lipase: WNL Troponin: Elevated @ 192.   Imaging Studies ordered:  I ordered imaging studies which included CXR, I independently visualized and interpreted imaging which showed no acute abnormality.   Medicines ordered:  I ordered medication morphine for pain & zofran.   Reevaluation: 18:10 After the interventions stated above, I reevaluated the patient and found him to be pain free. Updated on results & plan of care at this time.  Patient with  history concerning for unstable angina given initially pain with exertion, now today at rest. Repeat EKG without acute change. Will start heparin and consult cardiology per discussion with Dr. Darl Householder  Consultations Obtained: 18:42: CONSULT: Discussed case with cardiologist Dr. Marisue Ivan- will come see patient, likely admit to cardiology service.   Re-discussed with cardiology team- will admit.   Critical Interventions:  . Heparin   This is a shared visit with supervising physician Dr. Darl Householder who has independently evaluated patient & provided guidance in evaluation/management/disposition, in agreement with care   Portions of this note were generated with Dragon dictation software. Dictation errors may occur despite best attempts at proofreading.   Final Clinical Impression(s) / ED Diagnoses Final diagnoses:  NSTEMI (non-ST elevated myocardial infarction) [facility])    Rx / DC Orders ED Discharge Orders    None       Amaryllis Dyke, PA-C 08/23/19 2017    Drenda Freeze, MD 08/23/19 2150

## 2019-08-24 ENCOUNTER — Inpatient Hospital Stay (HOSPITAL_COMMUNITY): Payer: No Typology Code available for payment source

## 2019-08-24 ENCOUNTER — Encounter (HOSPITAL_COMMUNITY)
Admission: EM | Disposition: A | Discharge status: Home / Self Care | Payer: Self-pay | Source: Home / Self Care | Attending: Cardiovascular Disease

## 2019-08-24 DIAGNOSIS — Z720 Tobacco use: Secondary | ICD-10-CM

## 2019-08-24 DIAGNOSIS — I2510 Atherosclerotic heart disease of native coronary artery without angina pectoris: Secondary | ICD-10-CM

## 2019-08-24 DIAGNOSIS — D696 Thrombocytopenia, unspecified: Secondary | ICD-10-CM

## 2019-08-24 DIAGNOSIS — R079 Chest pain, unspecified: Secondary | ICD-10-CM | POA: Diagnosis not present

## 2019-08-24 DIAGNOSIS — I214 Non-ST elevation (NSTEMI) myocardial infarction: Secondary | ICD-10-CM | POA: Diagnosis not present

## 2019-08-24 HISTORY — PX: LEFT HEART CATH AND CORONARY ANGIOGRAPHY: CATH118249

## 2019-08-24 LAB — CBC
HCT: 40.9 % (ref 39.0–52.0)
HCT: 39.6 % (ref 39.0–52.0)
Hemoglobin: 13.6 g/dL (ref 13.0–17.0)
Hemoglobin: 13.4 g/dL (ref 13.0–17.0)
MCH: 33.2 pg (ref 26.0–34.0)
MCH: 33.8 pg (ref 26.0–34.0)
MCHC: 33.3 g/dL (ref 30.0–36.0)
MCHC: 33.8 g/dL (ref 30.0–36.0)
MCV: 99.8 fL (ref 80.0–100.0)
MCV: 99.7 fL (ref 80.0–100.0)
Platelets: 72 10*3/uL — ABNORMAL LOW (ref 150–400)
Platelets: 67 10*3/uL — ABNORMAL LOW (ref 150–400)
RBC: 4.1 MIL/uL — ABNORMAL LOW (ref 4.22–5.81)
RBC: 3.97 MIL/uL — ABNORMAL LOW (ref 4.22–5.81)
RDW: 14.6 % (ref 11.5–15.5)
RDW: 14.5 % (ref 11.5–15.5)
WBC: 5.6 10*3/uL (ref 4.0–10.5)
WBC: 5.1 10*3/uL (ref 4.0–10.5)
nRBC: 0 % (ref 0.0–0.2)
nRBC: 0 % (ref 0.0–0.2)

## 2019-08-24 LAB — BASIC METABOLIC PANEL
Anion gap: 8 (ref 5–15)
BUN: 5 mg/dL — ABNORMAL LOW (ref 6–20)
CO2: 26 mmol/L (ref 22–32)
Calcium: 8.4 mg/dL — ABNORMAL LOW (ref 8.9–10.3)
Chloride: 106 mmol/L (ref 98–111)
Creatinine, Ser: 0.93 mg/dL (ref 0.61–1.24)
GFR calc Af Amer: >60 mL/min (ref >60)
GFR calc non Af Amer: >60 mL/min (ref >60)
Glucose, Bld: 110 mg/dL — ABNORMAL HIGH (ref 70–99)
Potassium: 3.7 mmol/L (ref 3.5–5.1)
Sodium: 140 mmol/L (ref 135–145)

## 2019-08-24 LAB — POCT ACTIVATED CLOTTING TIME: Activated Clotting Time: 274 seconds

## 2019-08-24 LAB — TROPONIN I (HIGH SENSITIVITY)
Troponin I (High Sensitivity): 1414 ng/L (ref <18)
Troponin I (High Sensitivity): 3678 ng/L (ref <18)

## 2019-08-24 LAB — CREATININE, SERUM
Creatinine, Ser: 0.96 mg/dL (ref 0.61–1.24)
GFR calc Af Amer: >60 mL/min (ref >60)
GFR calc non Af Amer: >60 mL/min (ref >60)

## 2019-08-24 LAB — HEPATIC FUNCTION PANEL
ALT: 59 U/L — ABNORMAL HIGH (ref 0–44)
AST: 57 U/L — ABNORMAL HIGH (ref 15–41)
Albumin: 3.6 g/dL (ref 3.5–5.0)
Alkaline Phosphatase: 53 U/L (ref 38–126)
Bilirubin, Direct: 0.3 mg/dL — ABNORMAL HIGH (ref 0.0–0.2)
Indirect Bilirubin: 1 mg/dL — ABNORMAL HIGH (ref 0.3–0.9)
Total Bilirubin: 1.3 mg/dL — ABNORMAL HIGH (ref 0.3–1.2)
Total Protein: 5.9 g/dL — ABNORMAL LOW (ref 6.5–8.1)

## 2019-08-24 LAB — CBG MONITORING, ED: Glucose-Capillary: 78 mg/dL (ref 70–99)

## 2019-08-24 LAB — ECHOCARDIOGRAM COMPLETE
Height: 74 in
Weight: 4490.33 oz

## 2019-08-24 LAB — HEPARIN LEVEL (UNFRACTIONATED): Heparin Unfractionated: 0.15 IU/mL — ABNORMAL LOW (ref 0.30–0.70)

## 2019-08-24 LAB — HIV ANTIBODY (ROUTINE TESTING W REFLEX): HIV Screen 4th Generation wRfx: NONREACTIVE

## 2019-08-24 LAB — TSH: TSH: 1.718 u[IU]/mL (ref 0.350–4.500)

## 2019-08-24 SURGERY — LEFT HEART CATH AND CORONARY ANGIOGRAPHY
Anesthesia: LOCAL

## 2019-08-24 MED ORDER — ENOXAPARIN SODIUM 40 MG/0.4ML ~~LOC~~ SOLN
40.0000 mg | SUBCUTANEOUS | Status: DC
  Administered 2019-08-25: 40 mg via SUBCUTANEOUS
  Filled 2019-08-24: qty 0.4

## 2019-08-24 MED ORDER — FAMOTIDINE IN NACL 20-0.9 MG/50ML-% IV SOLN
INTRAVENOUS | Status: AC | PRN
  Administered 2019-08-24: 20 mg via INTRAVENOUS

## 2019-08-24 MED ORDER — CYCLOBENZAPRINE HCL 10 MG PO TABS: 10.0000 mg | ORAL_TABLET | ORAL | Status: DC | PRN

## 2019-08-24 MED ORDER — ONDANSETRON HCL 4 MG/2ML IJ SOLN
INTRAMUSCULAR | Status: DC | PRN
  Administered 2019-08-24: 4 mg via INTRAVENOUS

## 2019-08-24 MED ORDER — FENTANYL CITRATE (PF) 100 MCG/2ML IJ SOLN
INTRAMUSCULAR | Status: AC
  Filled 2019-08-24: qty 2

## 2019-08-24 MED ORDER — HEPARIN (PORCINE) IN NACL 1000-0.9 UT/500ML-% IV SOLN
INTRAVENOUS | Status: DC | PRN
  Administered 2019-08-24 (×2): 500 mL

## 2019-08-24 MED ORDER — CLOPIDOGREL BISULFATE 300 MG PO TABS
ORAL_TABLET | ORAL | Status: AC
  Filled 2019-08-24: qty 2

## 2019-08-24 MED ORDER — HEPARIN SODIUM (PORCINE) 1000 UNIT/ML IJ SOLN
INTRAMUSCULAR | Status: AC
  Filled 2019-08-24: qty 1

## 2019-08-24 MED ORDER — FENTANYL CITRATE (PF) 100 MCG/2ML IJ SOLN
INTRAMUSCULAR | Status: DC | PRN
  Administered 2019-08-24: 25 ug via INTRAVENOUS
  Administered 2019-08-24: 50 ug via INTRAVENOUS
  Administered 2019-08-24: 25 ug via INTRAVENOUS
  Administered 2019-08-24: 50 ug via INTRAVENOUS

## 2019-08-24 MED ORDER — IOHEXOL 350 MG/ML SOLN
INTRAVENOUS | Status: DC | PRN
  Administered 2019-08-24: 130 mL

## 2019-08-24 MED ORDER — CLOPIDOGREL BISULFATE 75 MG PO TABS
75.0000 mg | ORAL_TABLET | Freq: Every day | ORAL | Status: DC
  Administered 2019-08-25: 75 mg via ORAL
  Filled 2019-08-24: qty 1

## 2019-08-24 MED ORDER — ADULT MULTIVITAMIN W/MINERALS CH
1.0000 | ORAL_TABLET | Freq: Every day | ORAL | Status: DC
  Administered 2019-08-24 – 2019-08-25 (×2): 1 via ORAL
  Filled 2019-08-24 (×2): qty 1

## 2019-08-24 MED ORDER — SODIUM CHLORIDE 0.9% FLUSH
3.0000 mL | Freq: Two times a day (BID) | INTRAVENOUS | Status: DC
  Administered 2019-08-24 (×2): 3 mL via INTRAVENOUS

## 2019-08-24 MED ORDER — HEPARIN SODIUM (PORCINE) 1000 UNIT/ML IJ SOLN
INTRAMUSCULAR | Status: DC | PRN
  Administered 2019-08-24 (×2): 6000 [IU] via INTRAVENOUS

## 2019-08-24 MED ORDER — SODIUM CHLORIDE 0.9 % IV SOLN: 250.0000 mL | INTRAVENOUS | Status: DC | PRN

## 2019-08-24 MED ORDER — VERAPAMIL HCL 2.5 MG/ML IV SOLN
INTRAVENOUS | Status: DC | PRN
  Administered 2019-08-24: 10 mL via INTRA_ARTERIAL

## 2019-08-24 MED ORDER — CLOPIDOGREL BISULFATE 300 MG PO TABS
ORAL_TABLET | ORAL | Status: DC | PRN
  Administered 2019-08-24: 600 mg via ORAL

## 2019-08-24 MED ORDER — HEPARIN (PORCINE) IN NACL 1000-0.9 UT/500ML-% IV SOLN
INTRAVENOUS | Status: AC
  Filled 2019-08-24: qty 1000

## 2019-08-24 MED ORDER — MIDAZOLAM HCL 2 MG/2ML IJ SOLN
INTRAMUSCULAR | Status: AC
  Filled 2019-08-24: qty 2

## 2019-08-24 MED ORDER — PROMETHAZINE HCL 25 MG PO TABS: 25.0000 mg | ORAL_TABLET | ORAL | Status: DC | PRN

## 2019-08-24 MED ORDER — SODIUM CHLORIDE 0.9% FLUSH: 3.0000 mL | INTRAVENOUS | Status: DC | PRN

## 2019-08-24 MED ORDER — NITROGLYCERIN 1 MG/10 ML FOR IR/CATH LAB
INTRA_ARTERIAL | Status: DC | PRN
  Administered 2019-08-24: 300 ug via INTRACORONARY

## 2019-08-24 MED ORDER — ASPIRIN 81 MG PO CHEW
81.0000 mg | CHEWABLE_TABLET | Freq: Every day | ORAL | Status: DC
  Administered 2019-08-25: 81 mg via ORAL
  Filled 2019-08-24: qty 1

## 2019-08-24 MED ORDER — FAMOTIDINE IN NACL 20-0.9 MG/50ML-% IV SOLN
INTRAVENOUS | Status: AC
  Filled 2019-08-24: qty 50

## 2019-08-24 MED ORDER — ESCITALOPRAM OXALATE 10 MG PO TABS
30.0000 mg | ORAL_TABLET | Freq: Every day | ORAL | Status: DC
  Administered 2019-08-24 – 2019-08-25 (×2): 30 mg via ORAL
  Filled 2019-08-24 (×2): qty 3

## 2019-08-24 MED ORDER — NITROGLYCERIN 1 MG/10 ML FOR IR/CATH LAB
INTRA_ARTERIAL | Status: AC
  Filled 2019-08-24: qty 10

## 2019-08-24 MED ORDER — MIDAZOLAM HCL 2 MG/2ML IJ SOLN
INTRAMUSCULAR | Status: DC | PRN
  Administered 2019-08-24 (×3): 1 mg via INTRAVENOUS

## 2019-08-24 MED ORDER — PANTOPRAZOLE SODIUM 40 MG PO TBEC
40.0000 mg | DELAYED_RELEASE_TABLET | Freq: Two times a day (BID) | ORAL | Status: DC
  Administered 2019-08-24 – 2019-08-25 (×2): 40 mg via ORAL
  Filled 2019-08-24 (×2): qty 1

## 2019-08-24 MED ORDER — VERAPAMIL HCL 2.5 MG/ML IV SOLN
INTRAVENOUS | Status: AC
  Filled 2019-08-24: qty 2

## 2019-08-24 MED ORDER — BUSPIRONE HCL 5 MG PO TABS
7.5000 mg | ORAL_TABLET | Freq: Two times a day (BID) | ORAL | Status: DC
  Administered 2019-08-24 – 2019-08-25 (×2): 7.5 mg via ORAL
  Filled 2019-08-24 (×2): qty 2

## 2019-08-24 MED ORDER — SODIUM CHLORIDE 0.9 % IV SOLN: INTRAVENOUS | Status: AC

## 2019-08-24 MED ORDER — LIDOCAINE HCL (PF) 1 % IJ SOLN
INTRAMUSCULAR | Status: DC | PRN
  Administered 2019-08-24: 2 mL

## 2019-08-24 MED ORDER — ONDANSETRON HCL 4 MG/2ML IJ SOLN
INTRAMUSCULAR | Status: AC
  Filled 2019-08-24: qty 2

## 2019-08-24 MED ORDER — LIDOCAINE HCL (PF) 1 % IJ SOLN
INTRAMUSCULAR | Status: AC
  Filled 2019-08-24: qty 30

## 2019-08-24 SURGICAL SUPPLY — 18 items
BALLN EMERGE MR 2.0X12 (BALLOONS) ×2
BALLOON EMERGE MR 2.0X12 (BALLOONS) ×1 IMPLANT
BRACE RADIAL COMPRESSION RADST (HEMOSTASIS) ×2 IMPLANT
CATH INFINITI 5FR ANG PIGTAIL (CATHETERS) ×2 IMPLANT
CATH INFINITI 5FR JL4 (CATHETERS) ×2 IMPLANT
CATH INFINITI JR4 5F (CATHETERS) ×2 IMPLANT
CATH LAUNCHER 6FR EBU 4 (CATHETERS) ×2 IMPLANT
CATH VISTA GUIDE 6FR XB3.5 (CATHETERS) ×2 IMPLANT
GLIDESHEATH SLEND SS 6F .021 (SHEATH) ×2 IMPLANT
GUIDEWIRE INQWIRE 1.5J.035X260 (WIRE) ×1 IMPLANT
INQWIRE 1.5J .035X260CM (WIRE) ×2
KIT ENCORE 26 ADVANTAGE (KITS) ×2 IMPLANT
KIT HEART LEFT (KITS) ×2 IMPLANT
PACK CARDIAC CATHETERIZATION (CUSTOM PROCEDURE TRAY) ×2 IMPLANT
STENT RESOLUTE ONYX 2.5X15 (Permanent Stent) ×2 IMPLANT
TRANSDUCER W/STOPCOCK (MISCELLANEOUS) ×2 IMPLANT
TUBING CIL FLEX 10 FLL-RA (TUBING) ×2 IMPLANT
WIRE RUNTHROUGH .014X180CM (WIRE) ×2 IMPLANT

## 2019-08-24 NOTE — Progress Notes (Signed)
Cardiology Progress Note  Patient ID: Kenneth Mckee MRN: [id number] DOB: [birth year] Date of Encounter: 08/24/2019  Primary Cardiologist: Reatha Harps, MD  Subjective  No chest pain. LHC today.   ROS:  All other ROS reviewed and negative. Pertinent positives noted in the HPI.     Inpatient Medications  Scheduled Meds: . [MAR Hold] aspirin  81 mg Oral Daily  . [MAR Hold] atorvastatin  40 mg Oral q1800  . [MAR Hold] metoprolol tartrate  12.5 mg Oral BID  . [MAR Hold] sodium chloride flush  3 mL Intravenous Q12H  . sodium chloride flush  3 mL Intravenous Q12H   Continuous Infusions: . [MAR Hold] sodium chloride    . sodium chloride    . sodium chloride    . heparin Stopped (08/24/19 0806)   PRN Meds: [IRW Hold] sodium chloride, sodium chloride, [MAR Hold] acetaminophen, [MAR Hold] ALPRAZolam, [MAR Hold] nitroGLYCERIN, [MAR Hold] ondansetron (ZOFRAN) IV, [MAR Hold] sodium chloride flush, sodium chloride flush, [MAR Hold] zolpidem   Vital Signs   Vitals:   08/24/19 0329 08/24/19 0400 08/24/19 0500 08/24/19 0600  BP: (!) 142/91 (!) 147/89 (!) 143/88 (!) 155/90  Pulse: 65 (!) 58 62 (!) 59  Resp: 13 12 13 13   Temp:      TempSrc:      SpO2: 96% 97% 93% 96%  Weight:      Height:       No intake or output data in the 24 hours ending 08/24/19 0824 Last 3 Weights 08/23/2019 09/01/2016 06/12/2013  Weight (lbs) 280 lb 10.3 oz 280 lb 9.6 oz 280 lb 6.4 oz  Weight (kg) 127.3 kg 127.279 kg 127.189 kg      Telemetry  Overnight telemetry shows SR 60s with PVCs, which I personally reviewed.   ECG  The most recent ECG shows SR 62, RBBB, which I personally reviewed.   Physical Exam   Vitals:   08/24/19 0329 08/24/19 0400 08/24/19 0500 08/24/19 0600  BP: (!) 142/91 (!) 147/89 (!) 143/88 (!) 155/90  Pulse: 65 (!) 58 62 (!) 59  Resp: 13 12 13 13   Temp:      TempSrc:      SpO2: 96% 97% 93% 96%  Weight:      Height:       No intake or output data in the 24 hours ending 08/24/19  0824  Last 3 Weights 08/23/2019 09/01/2016 06/12/2013  Weight (lbs) 280 lb 10.3 oz 280 lb 9.6 oz 280 lb 6.4 oz  Weight (kg) 127.3 kg 127.279 kg 127.189 kg    Body mass index is 36.03 kg/m.  General: Well nourished, well developed, in no acute distress Head: Atraumatic, normal size  Eyes: PEERLA, EOMI  Neck: Supple, no JVD Endocrine: No thryomegaly Cardiac: Normal S1, S2; RRR; no murmurs, rubs, or gallops Lungs: Clear to auscultation bilaterally, no wheezing, rhonchi or rales  Abd: Soft, nontender, no hepatomegaly  Ext: No edema, pulses 2+ Musculoskeletal: No deformities, BUE and BLE strength normal and equal Skin: Warm and dry, no rashes   Neuro: Alert and oriented to person, place, time, and situation, CNII-XII grossly intact, no focal deficits  Psych: Normal mood and affect   Labs  High Sensitivity Troponin:   Recent Labs  Lab 08/23/19 1632 08/23/19 1818 08/23/19 2215 08/24/19 0615  TROPONINIHS 192* 499* 1,119* 1,414*     Cardiac EnzymesNo results for input(s): TROPONINI in the last 168 hours. No results for input(s): TROPIPOC in the last 168 hours.  Chemistry  Recent Labs  Lab 08/23/19 1632 08/24/19 0323  NA 141 140  K 3.9 3.7  CL 105 106  CO2 28 26  GLUCOSE 102* 110*  BUN 5* 5*  CREATININE 1.05 0.93  CALCIUM 8.8* 8.4*  PROT 6.1* 5.9*  ALBUMIN 3.7 3.6  AST 59* 57*  ALT 60* 59*  ALKPHOS 54 53  BILITOT 1.5* 1.3*  GFRNONAA >60 >60  GFRAA >60 >60  ANIONGAP 8 8    Hematology Recent Labs  Lab 08/23/19 1632 08/24/19 0451  WBC 8.0 5.6  RBC 4.19* 4.10*  HGB 13.9 13.6  HCT 42.0 40.9  MCV 100.2* 99.8  MCH 33.2 33.2  MCHC 33.1 33.3  RDW 14.4 14.6  PLT 76* 72*   BNPNo results for input(s): BNP, PROBNP in the last 168 hours.  DDimer No results for input(s): DDIMER in the last 168 hours.   Radiology  DG Chest 2 View  Result Date: 08/23/2019 CLINICAL DATA:  Chest pain for 1 day EXAM: CHEST - 2 VIEW COMPARISON:  None. FINDINGS: Cardiac shadow is within normal  limits. Lungs are well aerated bilaterally. No focal infiltrate or sizable effusion is seen. No acute bony abnormality is noted. Postsurgical changes are noted in the cervical spine. IMPRESSION: No acute abnormality noted. Electronically Signed   By: Inez Catalina M.D.   On: 08/23/2019 16:17    Cardiac Studies  Pending   Patient Profile  Kenneth Mckee is a 54 y.o. male with OSA, GERD, Fatty Liver, Thrombocytopenia, obesity, tobacco abuse admitted 08/23/2019 for NSTEMI.   Assessment & Plan   1. NSTEMI -worsening CP and elevated enzymes consistent with NSTEMI -no ST elevation -LHC today  -platelets chronically low and discussed with hematology who said no issue as long as >50k -continue aspirin, heparin drip, lipitor 40 mg QD -LDL 83, A1c 5.6, TSH 1.7 -no signs of volume overload -echo pending -continue metoprolol tartrate 12.5 mg BID  2. Thrombocytopenia, chronic -chronically low due to fatty liver.  -no cirrhosis -72k this AM -no bleeding  3. Tobacco Abuse -Needs to quit.   4. Severe OSA -BiPAP  5. Fatty Liver -Will obtain RUQ [place] -Statin  FEN -pre cath IVF -diet: NPO -dvt ppx: heparin drip -code: full   For questions or updates, please contact [place] Please consult [email or web address] for contact info under   Time Spent with Patient: I have spent a total of 25 minutes with patient reviewing hospital notes, telemetry, EKGs, labs and examining the patient as well as establishing an assessment and plan that was discussed with the patient.  > 50% of time was spent in direct patient care.    Signed, Addison Naegeli. Audie Box, [place]  08/24/2019 8:24 AM

## 2019-08-24 NOTE — ED Notes (Signed)
Pt pain free at this time.  C/o multiple concerns about his stay, unable to eat, no bed assignment etc.  Cath lab is ready at this time.  Consents signed.  IV patent.

## 2019-08-24 NOTE — Interval H&P Note (Signed)
Cath Lab Visit (complete for each Cath Lab visit)  Clinical Evaluation Leading to the Procedure:   ACS: Yes.    Non-ACS:  n/a   History and Physical Interval Note:  08/24/2019 8:19 AM  Kenneth Mckee  has presented today for surgery, with the diagnosis of NSTEMI.  The various methods of treatment have been discussed with the patient and family. After consideration of risks, benefits and other options for treatment, the patient has consented to  Procedure(s): LEFT HEART CATH AND CORONARY ANGIOGRAPHY (N/A) as a surgical intervention.  The patient's history has been reviewed, patient examined, no change in status, stable for surgery.  I have reviewed the patient's chart and labs.  Questions were answered to the patient's satisfaction.     [name]

## 2019-08-24 NOTE — Care Management (Signed)
[id number] 08-24-19 Case Manager called the [facility] center- after hours notification line to notify that the patient was hospitalized. Patient states he is active with the [facility]. Per after hours notification center this is an approved notification- unable to get a reference number due to system failure. Case Manager will continue to follow for additional transition of care needs. Gala Lewandowsky, RN,BSN Case Manager

## 2019-08-24 NOTE — Progress Notes (Signed)
  Echocardiogram 2D Echocardiogram has been performed.  Kenneth Mckee 08/24/2019, 4:28 PM

## 2019-08-24 NOTE — Progress Notes (Signed)
ANTICOAGULATION CONSULT NOTE   Pharmacy Consult for heparin Indication: chest pain/ACS  Allergies  Allergen Reactions  . Penicillins Other (See Comments)    From childhood  . Sumatriptan Other (See Comments)    Dizziness (intolerance)    Patient Measurements: Height: 6\' 2"  (188 cm) Weight: 127.3 kg (280 lb 10.3 oz) IBW/kg (Calculated) : 82.2 Heparin Dosing Weight: 110kg  Vital Signs: BP: 143/88 (04/16 0500) Pulse Rate: 62 (04/16 0500)  Labs: Recent Labs    08/23/19 1632 08/23/19 1818 08/23/19 2215 08/24/19 0323 08/24/19 0451  HGB 13.9  --   --   --   --   HCT 42.0  --   --   --   --   PLT 76*  --   --   --   --   LABPROT  --   --  14.4  --   --   INR  --   --  1.1  --   --   HEPARINUNFRC  --   --  0.10*  --  0.15*  CREATININE 1.05  --   --  0.93  --   TROPONINIHS 192* 499* 1,119*  --   --     Estimated Creatinine Clearance: 131.7 mL/min (by C-G formula based on SCr of 0.93 mg/dL).  Assessment: 72 YOM presenting with ongoing CP radiating to arms, diaphoretic.  Not on anticoagulation PTA, H/H wnl, plts low 76 (chronic thrombocytopenia).    Heparin level subtherapeutic (0.15) on gtt at 1300 units/hr. No issues with line or bleeding reported per RN.  Goal of Therapy:  Heparin level 0.3-0.7 units/ml Monitor platelets by anticoagulation protocol: Yes   Plan:  Increase heparin gtt to 1550 units/hr F/u 6 hour heparin level  44, PharmD, BCPS Please see amion for complete clinical pharmacist phone list 08/24/2019 5:38 AM

## 2019-08-25 DIAGNOSIS — I214 Non-ST elevation (NSTEMI) myocardial infarction: Secondary | ICD-10-CM | POA: Diagnosis not present

## 2019-08-25 LAB — BASIC METABOLIC PANEL
Anion gap: 9 (ref 5–15)
BUN: 5 mg/dL — ABNORMAL LOW (ref 6–20)
CO2: 28 mmol/L (ref 22–32)
Calcium: 8.6 mg/dL — ABNORMAL LOW (ref 8.9–10.3)
Chloride: 103 mmol/L (ref 98–111)
Creatinine, Ser: 1.08 mg/dL (ref 0.61–1.24)
GFR calc Af Amer: >60 mL/min (ref >60)
GFR calc non Af Amer: >60 mL/min (ref >60)
Glucose, Bld: 99 mg/dL (ref 70–99)
Potassium: 3.7 mmol/L (ref 3.5–5.1)
Sodium: 140 mmol/L (ref 135–145)

## 2019-08-25 LAB — CBC
HCT: 39.2 % (ref 39.0–52.0)
Hemoglobin: 13.2 g/dL (ref 13.0–17.0)
MCH: 33.5 pg (ref 26.0–34.0)
MCHC: 33.7 g/dL (ref 30.0–36.0)
MCV: 99.5 fL (ref 80.0–100.0)
Platelets: 72 10*3/uL — ABNORMAL LOW (ref 150–400)
RBC: 3.94 MIL/uL — ABNORMAL LOW (ref 4.22–5.81)
RDW: 14.6 % (ref 11.5–15.5)
WBC: 5.4 10*3/uL (ref 4.0–10.5)
nRBC: 0 % (ref 0.0–0.2)

## 2019-08-25 MED ORDER — CLOPIDOGREL BISULFATE 75 MG PO TABS: 75.0000 mg | ORAL_TABLET | Freq: Every day | ORAL | 3 refills | Status: DC

## 2019-08-25 MED ORDER — NITROGLYCERIN 0.4 MG SL SUBL: 0.4000 mg | SUBLINGUAL_TABLET | SUBLINGUAL | 1 refills | Status: DC | PRN

## 2019-08-25 MED ORDER — ASPIRIN 81 MG PO CHEW: 81.0000 mg | CHEWABLE_TABLET | Freq: Every day | ORAL | 3 refills | Status: DC

## 2019-08-25 MED ORDER — METOPROLOL TARTRATE 25 MG PO TABS: 12.5000 mg | ORAL_TABLET | Freq: Two times a day (BID) | ORAL | 6 refills | Status: DC

## 2019-08-25 MED ORDER — NITROGLYCERIN 0.4 MG SL SUBL: 0.4000 mg | SUBLINGUAL_TABLET | SUBLINGUAL | 1 refills | Status: AC | PRN

## 2019-08-25 MED ORDER — FUROSEMIDE 10 MG/ML IJ SOLN
40.0000 mg | Freq: Once | INTRAMUSCULAR | Status: AC
  Administered 2019-08-25: 40 mg via INTRAVENOUS
  Filled 2019-08-25: qty 4

## 2019-08-25 MED ORDER — ATORVASTATIN CALCIUM 40 MG PO TABS: 40.0000 mg | ORAL_TABLET | Freq: Every day | ORAL | 6 refills | Status: DC

## 2019-08-25 NOTE — Discharge Summary (Signed)
Discharge Summary  Patient ID: Kenneth Mckee MRN: [id number] DOB: Feb 26, 1966  Admit date: 08/23/2019 Discharge date: 08/25/2019  Primary Care Provider: System, Pcp Not In  Primary Cardiologist: Reatha Harps, MD   Discharge Diagnoses  Principal Problem:   NSTEMI (non-ST elevated myocardial infarction) [facility]) Active Problems:   OSA treated with BiPAP   OBESITY   Transaminitis   Thrombocytopenia (HCC)   Tobacco abuse   HLD (hyperlipidemia)   Allergies Allergies  Allergen Reactions  . Penicillins Other (Mckee Comments)    From childhood  . Sumatriptan Other (Mckee Comments)    Dizziness (intolerance)    Diagnostic Studies/Procedures  TTE 08/24/2019 1. Left ventricular ejection fraction, by estimation, is 55%. The left  ventricle has normal function. The left ventricle has no regional wall  motion abnormalities. Left ventricular diastolic parameters were normal.  2. Right ventricular systolic function is normal. The right ventricular  size is normal. There is normal pulmonary artery systolic pressure.  3. The mitral valve is normal in structure. Trivial mitral valve  regurgitation. No evidence of mitral stenosis.  4. The aortic valve is tricuspid. Aortic valve regurgitation is trivial.  Mild aortic valve sclerosis is present, with no evidence of aortic valve  stenosis.  5. The inferior vena cava is normal in size with greater than 50%  respiratory variability, suggesting right atrial pressure of 3 mmHg.    LHC 08/24/2019  Prox RCA lesion is 40% stenosed.  Mid RCA lesion is 50% stenosed.  Mid Cx lesion is 99% stenosed.  Post intervention, there is a 0% residual stenosis.  A drug-eluting stent was successfully placed using a STENT RESOLUTE ONYX 2.5X15.  1st Diag lesion is 99% stenosed.   1.  Significant two-vessel coronary artery disease involving mid left circumflex and first diagonal. 2.  Mildly elevated left ventricular end-diastolic pressure at 22 mmHg.   Left ventricular angiography was not performed. 3.  Successful angioplasty and drug-eluting stent placement to the mid left circumflex.  Recommendations: Left radial approach was chosen given the patient's reported history of right hand ischemia in the remote past with no palpable ulnar pulse on the right. The plan was to perform PCI of the diagonal also.  However, the patient had severe heartburn and uncontrollable cough after he was given 600 mg of clopidogrel.  He could not hold still for the rest of the procedure and when we were finally able to sedate him, he became hypoxic from oversedation. The diagonal appears to be 2 mm in diameter.  Continue medical therapy for now.  If there is any recurrent anginal symptoms, diagonal PCI can be staged for Monday after we ensure optimal control of his GERD.  I increased his Protonix to twice daily.  RUQ [place] 08/24/2019 1.  Hepatic steatosis.  2.  Status post cholecystectomy.  History of Present Illness    Kenneth Mckee is a 54 y.o. male with OSA, GERD, Fatty Liver, Thrombocytopenia, obesity, tobacco abuse admitted 08/23/2019 for NSTEMI.   Hospital Course   NSTEMI: Admitted with chest pain and elevated cardiac enzymes consistent with ACS. He was taken to the cath lab 08/24/2019 and found to have 99% mid LCX and 99% first diagonal branch. He underwent successful PCI to the mid LCX but developed uncontrolled GERD symptoms and was unable to tolerate PCI to the first diagonal branch. The diagonal branch was noted to be small (2 mm) vessel. I discussed the possibility of performing PCI on this vessel versus medical management. Mr. Geathers reported that  he had no further chest pain symptoms and walked the halls without angina. He reported he would like to manage this medically for now. He was discharged home on aspirin and plavix and will complete 1 year of DAPT. He will continue 40 mg lipitor. He also will start metoprolol tartrate 12.5 mg BID. We will plan to  have him follow with [place] in 1 week of discharge. His Left radial cath site looked good without evidence of hematoma or bruit. Echocardiogram demonstrated normal LVEF without WMA.   Thrombocytopenia/Fatty Liver: Mr. Luhn had low platelets this admission and this has been a chronic issue. His ACS was discussed with hematology who felt this was not an issue. He did undergo a RUQ ultrasound which showed a fatty liver. He will remain on a statin moving forward.   Hyperlipidemia: LDL 83. He will need to have a repeat lipid profile in 4-6 weeks.   Tobacco Abuse: Smoking cessation counseling was provided.   Physical Exam/Data:   Vitals:   08/24/19 2102 08/25/19 0026 08/25/19 0503 08/25/19 0800  BP:  123/79 138/89 138/76  Pulse: 72 67 64   Resp:  (!) 21 18   Temp:  98.4 F (36.9 C) 98.6 F (37 C)   TempSrc:  Oral Oral   SpO2:  95% 95%   Weight:   134.4 kg   Height:         Intake/Output Summary (Last 24 hours) at 08/25/2019 1654 Last data filed at 08/25/2019 1200 Gross per 24 hour  Intake 483 ml  Output 1100 ml  Net -617 ml    Last 3 Weights 08/25/2019 08/23/2019 09/01/2016  Weight (lbs) 296 lb 6.4 oz 280 lb 10.3 oz 280 lb 9.6 oz  Weight (kg) 134.446 kg 127.3 kg 127.279 kg    Body mass index is 38.06 kg/m.   General: Well nourished, well developed, in no acute distress Head: Atraumatic, normal size  Eyes: PEERLA, EOMI  Neck: Supple, no JVD Endocrine: No thryomegaly Cardiac: Normal S1, S2; RRR; no murmurs, rubs, or gallops Lungs: Clear to auscultation bilaterally, no wheezing, rhonchi or rales  Abd: Soft, nontender, no hepatomegaly  Ext: No edema, pulses 2+, L radial cath site with bruising, 2+ pulse, no hematoma  Musculoskeletal: No deformities, BUE and BLE strength normal and equal Skin: Warm and dry, no rashes   Neuro: Alert and oriented to person, place, time, and situation, CNII-XII grossly intact, no focal deficits  Psych: Normal mood and affect   Labs & Radiologic Studies   CBC Recent Labs    08/23/19 1632 08/24/19 0451 08/24/19 1311 08/25/19 0458  WBC 8.0   < > 5.1 5.4  NEUTROABS 6.5  --   --   --   HGB 13.9   < > 13.4 13.2  HCT 42.0   < > 39.6 39.2  MCV 100.2*   < > 99.7 99.5  PLT 76*   < > 67* 72*   < > = values in this interval not displayed.   Basic Metabolic Panel Recent Labs    04/54/09 0323 08/24/19 0323 08/24/19 1311 08/25/19 0458  NA 140  --   --  140  K 3.7  --   --  3.7  CL 106  --   --  103  CO2 26  --   --  28  GLUCOSE 110*  --   --  99  BUN 5*  --   --  5*  CREATININE 0.93   < > 0.96 1.08  CALCIUM 8.4*  --   --  8.6*   < > = values in this interval not displayed.   Liver Function Tests Recent Labs    08/23/19 1632 08/24/19 0323  AST 59* 57*  ALT 60* 59*  ALKPHOS 54 53  BILITOT 1.5* 1.3*  PROT 6.1* 5.9*  ALBUMIN 3.7 3.6   Recent Labs    08/23/19 1632  LIPASE 31   Cardiac Enzymes No results for input(s): CKTOTAL, CKMB, CKMBINDEX, TROPONINI in the last 72 hours. BNP Invalid input(s): POCBNP D-Dimer No results for input(s): DDIMER in the last 72 hours. Hemoglobin A1C Recent Labs    08/23/19 2215  HGBA1C 5.6   Fasting Lipid Panel Recent Labs    08/23/19 2215  CHOL 128  HDL 22*  LDLCALC 83  TRIG 695  CHOLHDL 5.8   Thyroid Function Tests Recent Labs    08/23/19 2215  TSH 1.718   _____________  DG Chest 2 View  Result Date: 08/23/2019 CLINICAL DATA:  Chest pain for 1 day EXAM: CHEST - 2 VIEW COMPARISON:  None. FINDINGS: Cardiac shadow is within normal limits. Lungs are well aerated bilaterally. No focal infiltrate or sizable effusion is seen. No acute bony abnormality is noted. Postsurgical changes are noted in the cervical spine. IMPRESSION: No acute abnormality noted. Electronically Signed   By: Alcide Clever M.D.   On: 08/23/2019 16:17   CARDIAC CATHETERIZATION  Result Date: 08/24/2019  Prox RCA lesion is 40% stenosed.  Mid RCA lesion is 50% stenosed.  Mid Cx lesion is 99% stenosed.  Post  intervention, there is a 0% residual stenosis.  A drug-eluting stent was successfully placed using a STENT RESOLUTE ONYX 2.5X15.  1st Diag lesion is 99% stenosed.  1.  Significant two-vessel coronary artery disease involving mid left circumflex and first diagonal. 2.  Mildly elevated left ventricular end-diastolic pressure at 22 mmHg.  Left ventricular angiography was not performed. 3.  Successful angioplasty and drug-eluting stent placement to the mid left circumflex. Recommendations: Left radial approach was chosen given the patient's reported history of right hand ischemia in the remote past with no palpable ulnar pulse on the right. The plan was to perform PCI of the diagonal also.  However, the patient had severe heartburn and uncontrollable cough after he was given 600 mg of clopidogrel.  He could not hold still for the rest of the procedure and when we were finally able to sedate him, he became hypoxic from oversedation. The diagonal appears to be 2 mm in diameter.  Continue medical therapy for now.  If there is any recurrent anginal symptoms, diagonal PCI can be staged for Monday after we ensure optimal control of his GERD.  I increased his Protonix to twice daily.   ECHOCARDIOGRAM COMPLETE  Result Date: 08/24/2019    ECHOCARDIOGRAM REPORT   Patient Name:   EDGE MAUGER Date of Exam: 08/24/2019 Medical Rec #:  [id number]     Height:       74.0 in Accession #:    [id number]    Weight:       280.6 lb Date of Birth:  [birth year]     BSA:          2.510 m Patient Age:    53 years      BP:           117/81 mmHg Patient Gender: M             HR:  62 bpm. Exam Location:  Inpatient Procedure: 2D Echo Indications:    chest pain 786.50  History:        Patient has no prior history of Echocardiogram examinations.                 Risk Factors:Sleep Apnea, Dyslipidemia and Current Smoker.  Sonographer:    Johny Chess Referring Phys: 68 RHONDA G BARRETT  Sonographer Comments: Image acquisition  challenging due to patient body habitus. IMPRESSIONS  1. Left ventricular ejection fraction, by estimation, is 55%. The left ventricle has normal function. The left ventricle has no regional wall motion abnormalities. Left ventricular diastolic parameters were normal.  2. Right ventricular systolic function is normal. The right ventricular size is normal. There is normal pulmonary artery systolic pressure.  3. The mitral valve is normal in structure. Trivial mitral valve regurgitation. No evidence of mitral stenosis.  4. The aortic valve is tricuspid. Aortic valve regurgitation is trivial. Mild aortic valve sclerosis is present, with no evidence of aortic valve stenosis.  5. The inferior vena cava is normal in size with greater than 50% respiratory variability, suggesting right atrial pressure of 3 mmHg. FINDINGS  Left Ventricle: Left ventricular ejection fraction, by estimation, is 55%. The left ventricle has normal function. The left ventricle has no regional wall motion abnormalities. The left ventricular internal cavity size was normal in size. There is no left ventricular hypertrophy. Left ventricular diastolic parameters were normal. Right Ventricle: The right ventricular size is normal. No increase in right ventricular wall thickness. Right ventricular systolic function is normal. There is normal pulmonary artery systolic pressure. The tricuspid regurgitant velocity is 2.48 m/s, and  with an assumed right atrial pressure of 3 mmHg, the estimated right ventricular systolic pressure is 19.3 mmHg. Left Atrium: Left atrial size was normal in size. Right Atrium: Right atrial size was normal in size. Pericardium: There is no evidence of pericardial effusion. Mitral Valve: The mitral valve is normal in structure. There is mild thickening of the mitral valve leaflet(s). Normal mobility of the mitral valve leaflets. Trivial mitral valve regurgitation. No evidence of mitral valve stenosis. Tricuspid Valve: The  tricuspid valve is normal in structure. Tricuspid valve regurgitation is trivial. No evidence of tricuspid stenosis. Aortic Valve: The aortic valve is tricuspid. Aortic valve regurgitation is trivial. Mild aortic valve sclerosis is present, with no evidence of aortic valve stenosis. Pulmonic Valve: The pulmonic valve was normal in structure. Pulmonic valve regurgitation is not visualized. No evidence of pulmonic stenosis. Aorta: The aortic root is normal in size and structure. Venous: The inferior vena cava is normal in size with greater than 50% respiratory variability, suggesting right atrial pressure of 3 mmHg. IAS/Shunts: No atrial level shunt detected by color flow Doppler.  LEFT VENTRICLE PLAX 2D LVIDd:         5.20 cm Diastology LVIDs:         3.80 cm LV e' lateral:   7.29 cm/s                        LV E/e' lateral: 14.0                        LV e' medial:    5.98 cm/s                        LV E/e' medial:  17.1  RIGHT VENTRICLE RV S prime:  10.80 cm/s TAPSE (M-mode): 1.9 cm LEFT ATRIUM             Index       RIGHT ATRIUM           Index LA diam:        3.60 cm 1.43 cm/m  RA Area:     15.20 cm LA Vol (A2C):   50.2 ml 20.00 ml/m RA Volume:   38.60 ml  15.38 ml/m LA Vol (A4C):   45.1 ml 17.97 ml/m LA Biplane Vol: 48.9 ml 19.48 ml/m  AORTIC VALVE LVOT Vmax:   128.00 cm/s LVOT Vmean:  84.500 cm/s LVOT VTI:    0.276 m  AORTA Ao Root diam: 3.20 cm MITRAL VALVE                TRICUSPID VALVE MV Area (PHT): 3.00 cm     TR Peak grad:   24.6 mmHg MV Decel Time: 253 msec     TR Vmax:        248.00 cm/s MV E velocity: 102.00 cm/s MV A velocity: 106.00 cm/s  SHUNTS MV E/A ratio:  0.96         Systemic VTI: 0.28 m Charlton HawsPeter Nishan MD Electronically signed by Charlton HawsPeter Nishan MD Signature Date/Time: 08/24/2019/4:35:19 PM    Final    US Abdomen Limited RUQ  Result Date: 08/24/2019 CLINICAL DATA:  Transaminitis EXAM: ULTRASOUND ABDOMEN LIMITED RIGHT UPPER QUADRANT COMPARISON:  None. FINDINGS: Gallbladder: Status post  cholecystectomy. Common bile duct: Diameter: 6 mm Liver: No focal lesion identified. Increased parenchymal echogenicity. Portal vein is patent on color Doppler imaging with normal direction of blood flow towards the liver. Other: None. IMPRESSION: 1.  Hepatic steatosis. 2.  Status post cholecystectomy. Electronically Signed   By: Lauralyn PrimesAlex  Bibbey M.D.   On: 08/24/2019 16:58   Disposition  Pt is being discharged home today in good condition.  Follow-up Plans & Appointments   Follow-[facility] Information    [facility] [address] [facility].   Specialty: Cardiology Why: The office will call to arrange appointment in 1 week for hospital follow-up.  Contact information: [address], [address] [place] [id number] [phone]         Discharge Instructions    Amb Referral to Cardiac Rehabilitation   Complete by: As directed    Diagnosis:  Coronary Stents NSTEMI     After initial evaluation and assessments completed: Virtual Based Care may be provided alone or in conjunction with Phase 2 Cardiac Rehab based on patient barriers.: Yes   Call MD for:  difficulty breathing, headache or visual disturbances   Complete by: As directed    Call MD for:  persistant nausea and vomiting   Complete by: As directed    Call MD for:  redness, tenderness, or signs of infection (pain, swelling, redness, odor or green/yellow discharge around incision site)   Complete by: As directed    Call MD for:  severe uncontrolled pain   Complete by: As directed    Call MD for:  temperature >100.4   Complete by: As directed    Diet - low sodium heart healthy   Complete by: As directed    Discharge instructions   Complete by: As directed    No driving for 1 week. No lifting over 10 lbs for 2 weeks. No sexual activity for 2 weeks. You may return to work when cleared by your cardiologist. Keep procedure site clean & dry.  If you notice increased pain, swelling, bleeding or pus,  call/return!  You may shower, but no soaking baths/hot tubs/pools for 1 week.   Increase activity slowly   Complete by: As directed       Discharge Medications   Allergies as of 08/25/2019      Reactions   Penicillins Other (Mckee Comments)   From childhood   Sumatriptan Other (Mckee Comments)   Dizziness (intolerance)      Medication List    STOP taking these medications   ibuprofen 800 MG tablet Commonly known as: ADVIL     TAKE these medications   aspirin 81 MG chewable tablet Chew 1 tablet (81 mg total) by mouth daily. Start taking on: August 26, 2019   atorvastatin 40 MG tablet Commonly known as: LIPITOR Take 1 tablet (40 mg total) by mouth daily at 6 PM.   Benzoyl Peroxide 10 % Liqd Apply 1 application topically daily. Use to wash affected area   busPIRone 15 MG tablet Commonly known as: BUSPAR Take 7.5 mg by mouth 2 (two) times daily.   Cholecalciferol 50 MCG (2000 UT) Tabs Take 1 tablet by mouth daily.   clindamycin 1 % external solution Commonly known as: CLEOCIN T Apply 1 application topically daily.   clopidogrel 75 MG tablet Commonly known as: PLAVIX Take 1 tablet (75 mg total) by mouth daily with breakfast. Start taking on: August 26, 2019   cyclobenzaprine 10 MG tablet Commonly known as: FLEXERIL Take 10 mg by mouth as needed for muscle spasms.   doxycycline 100 MG capsule Commonly known as: MONODOX Take 100 mg by mouth as needed (for flare ups).   escitalopram 10 MG tablet Commonly known as: LEXAPRO Take 10 mg by mouth 3 (three) times daily.   gabapentin 300 MG capsule Commonly known as: NEURONTIN Take 300 mg by mouth as needed for pain.   Meclizine HCl 25 MG Chew Chew 25 mg by mouth as needed for dizziness.   methocarbamol 500 MG tablet Commonly known as: ROBAXIN Take 500 mg by mouth as needed for muscle spasms.   metoprolol tartrate 25 MG tablet Commonly known as: LOPRESSOR Take 0.5 tablets (12.5 mg total) by mouth 2 (two) times  daily.   Multiple Vitamins-Iron Tabs Take 1 tablet by mouth daily.   nitroGLYCERIN 0.4 MG SL tablet Commonly known as: NITROSTAT Place 1 tablet (0.4 mg total) under the tongue every 5 (five) minutes x 3 doses as needed for chest pain.   pantoprazole 40 MG tablet Commonly known as: PROTONIX Take 40 mg by mouth.   promethazine 25 MG tablet Commonly known as: PHENERGAN Take 25 mg by mouth as needed for nausea.   rizatriptan 5 MG tablet Commonly known as: MAXALT Take 2.5 mg by mouth as needed for migraine.      Yes                               AHA/ACC Clinical Performance & Quality Measures: 1. Aspirin prescribed? - Yes 2. ADP Receptor Inhibitor (Plavix/Clopidogrel, Brilinta/Ticagrelor or Effient/Prasugrel) prescribed (includes medically managed patients)? - Yes 3. Beta Blocker prescribed? - Yes 4. High Intensity Statin (Lipitor 40-80mg  or Crestor 20-40mg ) prescribed? - Yes 5. EF assessed during THIS hospitalization? - Yes 6. For EF <40%, was ACEI/ARB prescribed? - Not Applicable (EF >/= 40%) 7. For EF <40%, Aldosterone Antagonist (Spironolactone or Eplerenone) prescribed? - Not Applicable (EF >/= 40%) 8. Cardiac Rehab Phase II ordered (  Included Medically managed Patients)? - Yes   Duration of Discharge Encounter   Time Spent with Patient: I have spent a total of 45 minutes with patient reviewing hospital notes, telemetry, EKGs, labs and examining the patient as well as establishing an assessment and plan that was discussed with the patient.  > 50% of time was spent in direct patient care.  Signed, Lenna Gilford. Flora Lipps, MD [facility]  [facility]  08/25/2019 4:54 PM

## 2019-08-25 NOTE — Progress Notes (Signed)
CARDIAC REHAB PHASE I   PRE:  Rate/Rhythm: 65 SR  BP:  Sitting: 138/76      SaO2: 96 RA  MODE:  Ambulation: 350 ft   POST:  Rate/Rhythm: 80 SR  BP:  Sitting: 150/77    SaO2: 98 RA  Pt ambulated 355ft in hallway independently with steady gait. Pt denies CP, SOB, or dizziness. Pt educated on importance of ASA, Plavix, statin, and NTG. Pt given Mi book and heart healthy diet. Discussed smoking cessation, pt states he has never tried to quit before, but thinks he might need to now, provided with smoking cessation tip sheet. Reviewed restrictions, site care, and exercise guidelines. Encouraged ambulation as able. Pt denying all symptoms that brought him into hospital and hopeful for d/c soon. Pt educated on when to call the doctor. Will refer to CRP II [place]. Pt is interested in participating in Virtual Cardiac and Pulmonary Rehab. Pt advised that Virtual Cardiac and Pulmonary Rehab is provided at no cost to the patient.  Checklist:  1. Pt has smart device  ie smartphone and/or ipad for downloading an app  Yes 2. Reliable internet/wifi service    Yes 3. Understands how to use their smartphone and navigate within an app.  Yes  Pt verbalized understanding and is in agreement.  [id number] Reynold Bowen, RN BSN 08/25/2019 9:15 AM

## 2019-08-25 NOTE — Progress Notes (Signed)
[facility], pt's stable, DC home via wheelchair with paper scripts.

## 2019-08-25 NOTE — Progress Notes (Signed)
Pt reported having some sort of reaction ( acid reflux) to Plavix yesterday, RN confirmed with Fransico Michael, PA that ok to give pt this morning dose.

## 2019-08-25 NOTE — Discharge Instructions (Signed)
Clopidogrel tablets What is this medicine? CLOPIDOGREL (kloh PID oh grel) helps to prevent blood clots. This medicine is used to prevent heart attack, stroke, or other vascular events in people who are at high risk. This medicine may be used for other purposes; ask your health care provider or pharmacist if you have questions. COMMON BRAND NAME(S): Plavix What should I tell my health care provider before I take this medicine? They need to know if you have any of the following conditions:  bleeding disorders  bleeding in the brain  having surgery  history of stomach bleeding  an unusual or allergic reaction to clopidogrel, other medicines, foods, dyes, or preservatives  pregnant or trying to get pregnant  breast-feeding How should I use this medicine? Take this medicine by mouth with a glass of water. Follow the directions on the prescription label. You may take this medicine with or without food. If it upsets your stomach, take it with food. Take your medicine at regular intervals. Johny Pitstick not take it more often than directed. Moris Ratchford not stop taking except on your doctor's advice. A special MedGuide will be given to you by the pharmacist with each prescription and refill. Be sure to read this information carefully each time. Talk to your pediatrician regarding the use of this medicine in children. Special care may be needed. Overdosage: If you think you have taken too much of this medicine contact a poison control center or emergency room at once. NOTE: This medicine is only for you. Jibri Schriefer not share this medicine with others. What if I miss a dose? If you miss a dose, take it as soon as you can. If it is almost time for your next dose, take only that dose. Camaya Gannett not take double or extra doses. What may interact with this medicine? Tonianne Fine not take this medicine with the following medications:  dasabuvir; ombitasvir; paritaprevir; ritonavir  defibrotide  selexipag This medicine may also interact with  the following medications:  certain medicines that treat or prevent blood clots like warfarin  narcotic medicines for pain  NSAIDs, medicines for pain and inflammation, like ibuprofen or naproxen  repaglinide  SNRIs, medicines for depression, like desvenlafaxine, duloxetine, levomilnacipran, venlafaxine  SSRIs, medicines for depression, like citalopram, escitalopram, fluoxetine, fluvoxamine, paroxetine, sertraline  stomach acid blockers like cimetidine, esomeprazole, omeprazole This list may not describe all possible interactions. Give your health care provider a list of all the medicines, herbs, non-prescription drugs, or dietary supplements you use. Also tell them if you smoke, drink alcohol, or use illegal drugs. Some items may interact with your medicine. What should I watch for while using this medicine? Visit your doctor or health care professional for regular check-ups. Brallan Denio not stop taking your medicine unless your doctor tells you to. Notify your doctor or health care professional and seek emergency treatment if you develop breathing problems; changes in vision; chest pain; severe, sudden headache; pain, swelling, warmth in the leg; trouble speaking; sudden numbness or weakness of the face, arm or leg. These can be signs that your condition has gotten worse. If you are going to have surgery or dental work, tell your doctor or health care professional that you are taking this medicine. Certain genetic factors may reduce the effect of this medicine. Your doctor may use genetic tests to determine treatment. Only take aspirin if you are instructed to. Low doses of aspirin are used with this medicine to treat some conditions. Taking aspirin with this medicine can increase your risk of bleeding so   you must be careful. Talk to your doctor or pharmacist if you have questions. What side effects may I notice from receiving this medicine? Side effects that you should report to your doctor or  health care professional as soon as possible:  allergic reactions like skin rash, itching or hives, swelling of the face, lips, or tongue  signs and symptoms of bleeding such as bloody or black, tarry stools; red or dark-brown urine; spitting up blood or brown material that looks like coffee grounds; red spots on the skin; unusual bruising or bleeding from the eye, gums, or nose  signs and symptoms of a blood clot such as breathing problems; changes in vision; chest pain; severe, sudden headache; pain, swelling, warmth in the leg; trouble speaking; sudden numbness or weakness of the face, arm or leg  signs and symptoms of low blood sugar such as feeling anxious; confusion; dizziness; increased hunger; unusually weak or tired; increased sweating; shakiness; cold, clammy skin; irritable; headache; blurred vision; fast heartbeat; loss of consciousness Side effects that usually Dulcinea Kinser not require medical attention (report to your doctor or health care professional if they continue or are bothersome):  constipation  diarrhea  headache  upset stomach This list may not describe all possible side effects. Call your doctor for medical advice about side effects. You may report side effects to FDA at 1-800-FDA-1088. Where should I keep my medicine? Keep out of the reach of children. Store at room temperature of 59 to 86 degrees F (15 to 30 degrees C). Throw away any unused medicine after the expiration date. NOTE: This sheet is a summary. It may not cover all possible information. If you have questions about this medicine, talk to your doctor, pharmacist, or health care provider.  2020 Elsevier/Gold Standard (2017-09-26 15:03:38) Radial Site Care  This sheet gives you information about how to care for yourself after your procedure. Your health care provider may also give you more specific instructions. If you have problems or questions, contact your health care provider. What can I expect after the  procedure? After the procedure, it is common to have:  Bruising and tenderness at the catheter insertion area. Follow these instructions at home: Medicines  Take over-the-counter and prescription medicines only as told by your health care provider. Insertion site care  Follow instructions from your health care provider about how to take care of your insertion site. Make sure you: ? Wash your hands with soap and water before you change your bandage (dressing). If soap and water are not available, use hand sanitizer. ? Change your dressing as told by your health care provider. ? Leave stitches (sutures), skin glue, or adhesive strips in place. These skin closures may need to stay in place for 2 weeks or longer. If adhesive strip edges start to loosen and curl up, you may trim the loose edges. Tacey Dimaggio not remove adhesive strips completely unless your health care provider tells you to Zenaya Ulatowski that.  Check your insertion site every day for signs of infection. Check for: ? Redness, swelling, or pain. ? Fluid or blood. ? Pus or a bad smell. ? Warmth.  Brielyn Bosak not take baths, swim, or use a hot tub until your health care provider approves.  You may shower 24-48 hours after the procedure, or as directed by your health care provider. ? Remove the dressing and gently wash the site with plain soap and water. ? Pat the area dry with a clean towel. ? Bladen Umar not rub the site. That could   cause bleeding.  Lorelai Huyser not apply powder or lotion to the site. Activity   For 24 hours after the procedure, or as directed by your health care provider: ? Jamileth Putzier not flex or bend the affected arm. ? Elia Keenum not push or pull heavy objects with the affected arm. ? Ewen Varnell not drive yourself home from the hospital or clinic. You may drive 24 hours after the procedure unless your health care provider tells you not to. ? Lamija Besse not operate machinery or power tools.  Lacora Folmer not lift anything that is heavier than 10 lb (4.5 kg), or the limit that you are told,  until your health care provider says that it is safe.  Ask your health care provider when it is okay to: ? Return to work or school. ? Resume usual physical activities or sports. ? Resume sexual activity. General instructions  If the catheter site starts to bleed, raise your arm and put firm pressure on the site. If the bleeding does not stop, get help right away. This is a medical emergency.  If you went home on the same day as your procedure, a responsible adult should be with you for the first 24 hours after you arrive home.  Keep all follow-up visits as told by your health care provider. This is important. Contact a health care provider if:  You have a fever.  You have redness, swelling, or yellow drainage around your insertion site. Get help right away if:  You have unusual pain at the radial site.  The catheter insertion area swells very fast.  The insertion area is bleeding, and the bleeding does not stop when you hold steady pressure on the area.  Your arm or hand becomes pale, cool, tingly, or numb. These symptoms may represent a serious problem that is an emergency. Kassim Guertin not wait to see if the symptoms will go away. Get medical help right away. Call your local emergency services (911 in the [place] Deyra Perdomo not drive yourself to the hospital. Summary  After the procedure, it is common to have bruising and tenderness at the site.  Follow instructions from your health care provider about how to take care of your radial site wound. Check the wound every day for signs of infection.  Roberto Romanoski not lift anything that is heavier than 10 lb (4.5 kg), or the limit that you are told, until your health care provider says that it is safe. This information is not intended to replace advice given to you by your health care provider. Make sure you discuss any questions you have with your health care provider. Document Revised: 06/01/2017 Document Reviewed: 06/01/2017 Elsevier Patient Education  2020  Elsevier Inc.  

## 2019-08-27 ENCOUNTER — Telehealth: Payer: Self-pay | Admitting: Cardiovascular Disease

## 2019-08-27 NOTE — Telephone Encounter (Signed)
-----   Message from Cadence David Stall, PA-C sent at 08/25/2019 12:43 PM EDT ----- Regarding: [facility] appointment Patient is being discharged today for NSTEMI. He will need a post-PCI follow up in 1 week in [place]. This can be TOC if necessary(can be with APP or Dr. Flora Lipps). Then he will need TOC phone call as well. And then he needs a 3 week follow up with [place].  Thanks

## 2019-08-28 NOTE — Telephone Encounter (Signed)
The patient has been scheduled with Judy Pimple on 4/26.

## 2019-08-28 NOTE — Telephone Encounter (Signed)
Patient contacted regarding discharge from [facility] on April 17,2021  Patient understands to follow up with provider Charolette Child, PA on April 26,2021 at 2:15 at [address] [place] Patient understands discharge instructions? Yes.   Patient understands medications and regiment? yes Patient understands to bring all medications to this visit? Yes  Patient would like to sign up for my chart. He has hospital discharge instructions and was able to locate my chart instructions on page 8.    Patient reports he was told not to take Ibuprofen and is asking what he can take.  I advised him he can take tylenol.  He has chronic pain and will discuss other possible options at upcoming visit.

## 2019-09-02 NOTE — Progress Notes (Signed)
Cardiology Office Note   Date:  09/05/2019   ID:  Kenneth, Mckee 1966/04/22, MRN [id number]  PCP:  System, Pcp Not In  Cardiologist:  Reatha Harps, MD EP: None  Chief Complaint  Patient presents with  . Follow-up      History of Present Illness: Kenneth Mckee is a 54 y.o. male with PMH of recent NSTEMI 08/24/19 where he was diagnosed with CAD s/p PCI/DES to LCx with plans for PCI to 1st diagonal which were aborted after the patient developed uncontrolled GERD symptoms and was unable to lay still with plans for medical management, HLD, fatty liver disease, thrombocytopenia, GERD, obesity, and tobacco abuse who presents for post-hospital follow-up.   He was admitted to the hospital from 08/24/19-08/25/19 with chest pain where he was found to have elevated troponins (peaked 3600s). Taken to the cath lab which revealed moderate RCA disease, 99% LCx stenosis, and 99% 1st diagonal stenosis. He had successful PCI/DES to LCx, however patient suddenly developed uncontrolled GERD and was unable to lay still and unfortunately became hypoxic from oversedation therefore PCI to 1st diagonal was aborted.  He was started on aspirin and plavix. Echo revealed EF 55%, no RWMA, normal LV diastolic function, and no significant valvular abnormalities. Consideration for staged intervention was discussed and given lack of recurrent chest pain, medical management was pursued. In addition to aspirin and plavix, he was started on metoprolol tartrate 12.5mg  BID and atorvastatin 40mg  daily.   He presents today for post-hospital follow-up. He has lost 8lbs since discharge with dietary changes for which he was congratulated. No recurrent chest pain. No complaints of SOB, palpitations, LE edema, dizziness, lightheadedness, or syncope. Tolerating his medications without issues. Reports compliance with his DAPT. He is motivated to change his lifestyle to improve his health. He is interested in quitting smoking but  doesn't think he could do it cold . We discussed starting chantix, to which he was agreeable.     Past Medical History:  Diagnosis Date  . Arm vein blood clot    right arm  . Arthritis   . Chickenpox   . GERD (gastroesophageal reflux disease)   . NSTEMI (non-ST elevated myocardial infarction) (HCC) 08/23/2019  . Phlebitis    right wrist artery- so small. Artery so far down  no surgery indicated. Was on coumadin for 6 months in 2004  . Sleep apnea 2004-2006   sleep study (severe)' wears BiPap nightly  . Umbilical hernia     Past Surgical History:  Procedure Laterality Date  . ANTERIOR CERVICAL DECOMP/DISCECTOMY FUSION N/A 02/13/2013   Procedure: ANTERIOR CERVICAL DECOMPRESSION/DISCECTOMY FUSION Cervical Three-Four, Four-Five, Five-Six ;  Surgeon: 04/15/2013, MD;  Location: [facility]URO ORS;  Service: Neurosurgery;  Laterality: N/A;  ANTERIOR CERVICAL DECOMPRESSION/DISCECTOMY FUSION 3 LEVELS  . CERVICAL DISCECTOMY  2005  . CHOLECYSTECTOMY N/A 09/03/2012   Procedure: LAPAROSCOPIC CHOLECYSTECTOMY WITH INTRAOPERATIVE CHOLANGIOGRAM;  Surgeon: 09/05/2012, MD;  Location: [facility] OR;  Service: General;  Laterality: N/A;  . HERNIA REPAIR  2013   epigastric hernia repair w/mesh  . LEFT HEART CATH AND CORONARY ANGIOGRAPHY N/A 08/24/2019   Procedure: LEFT HEART CATH AND CORONARY ANGIOGRAPHY;  Surgeon: 08/26/2019, MD;  Location: [facility] INVASIVE CV LAB;  Service: Cardiovascular;  Laterality: N/A;  . NASAL SINUS SURGERY    . TONSILLECTOMY    . UPPER GI ENDOSCOPY       Current Outpatient Medications  Medication Sig Dispense Refill  . aspirin 81 MG  chewable tablet Chew 1 tablet (81 mg total) by mouth daily. 90 tablet 3  . atorvastatin (LIPITOR) 40 MG tablet Take 1 tablet (40 mg total) by mouth daily at 6 PM. 30 tablet 6  . Benzoyl Peroxide 10 % LIQD Apply 1 application topically daily. Use to wash affected area    . busPIRone (BUSPAR) 15 MG tablet Take 7.5 mg by mouth as needed.     .  Cholecalciferol 50 MCG (2000 UT) TABS Take 1 tablet by mouth daily.    . clindamycin (CLEOCIN T) 1 % external solution Apply 1 application topically daily.    . clopidogrel (PLAVIX) 75 MG tablet Take 1 tablet (75 mg total) by mouth daily with breakfast. 90 tablet 3  . cyclobenzaprine (FLEXERIL) 10 MG tablet Take 10 mg by mouth as needed for muscle spasms.    Marland Kitchen doxycycline (MONODOX) 100 MG capsule Take 100 mg by mouth as needed (for flare ups).     . escitalopram (LEXAPRO) 10 MG tablet Take 10 mg by mouth 3 (three) times daily.     Marland Kitchen gabapentin (NEURONTIN) 300 MG capsule Take 300 mg by mouth as needed for pain.    . metoprolol tartrate (LOPRESSOR) 25 MG tablet Take 0.5 tablets (12.5 mg total) by mouth 2 (two) times daily. 60 tablet 6  . Multiple Vitamins-Iron TABS Take 1 tablet by mouth daily.    . nitroGLYCERIN (NITROSTAT) 0.4 MG SL tablet Place 1 tablet (0.4 mg total) under the tongue every 5 (five) minutes x 3 doses as needed for chest pain. 25 tablet 1  . pantoprazole (PROTONIX) 40 MG tablet Take 40 mg by mouth.    . rizatriptan (MAXALT) 5 MG tablet Take 2.5 mg by mouth as needed for migraine.    . Meclizine HCl 25 MG CHEW Chew 25 mg by mouth as needed for dizziness.    . methocarbamol (ROBAXIN) 500 MG tablet Take 500 mg by mouth as needed for muscle spasms.    . promethazine (PHENERGAN) 25 MG tablet Take 25 mg by mouth as needed for nausea.    . varenicline (CHANTIX CONTINUING MONTH PAK) 1 MG tablet Take 1 tablet (1 mg total) by mouth 2 (two) times daily. 60 tablet 0  . varenicline (CHANTIX STARTING MONTH PAK) 0.5 MG X 11 & 1 MG X 42 tablet Take one 0.5 mg tablet by mouth once daily for 3 days, then increase to one 0.5 mg tablet twice daily for 4 days, then increase to one 1 mg tablet twice daily. 53 tablet 0   No current facility-administered medications for this visit.    Allergies:   Penicillins and Sumatriptan    Social History:  The patient  reports that he has been smoking  cigarettes. He has a 25.00 pack-year smoking history. He has never used smokeless tobacco. He reports that he does not drink alcohol or use drugs.   Family History:  The patient's family history includes Heart attack (age of onset: 43) in his father; Leukemia in his paternal grandfather.    ROS:  Please see the history of present illness.   Otherwise, review of systems are positive for none.   All other systems are reviewed and negative.    PHYSICAL EXAM: VS:  BP 112/70   Pulse (!) 57   Temp 98.2 F (36.8 C) Comment: Forehead  Ht 6\' 2"  (1.88 m)   Wt 289 lb 12.8 oz (131.5 kg)   SpO2 96%   BMI 37.21 kg/m  , BMI Body  mass index is 37.21 kg/m. GEN: Well nourished, well developed, in no acute distress HEENT: sclera anicteric Neck: no JVD, carotid bruits, or masses Cardiac: RRR; no murmurs, rubs, or gallops, no edema  Respiratory:  clear to auscultation bilaterally, normal work of breathing GI: soft, nontender, nondistended, + BS MS: no deformity or atrophy Skin: warm and dry, no rash Neuro:  Strength and sensation are intact Psych: euthymic mood, full affect   EKG:  EKG is ordered today. The ekg ordered today demonstrates sinus bradycardia, rate 57 bpm, chronic RBBB, no STE/D, no TWI; no significant change from previous.   Recent Labs: 08/23/2019: TSH 1.718 08/24/2019: ALT 59 09/03/2019: BUN 6; Creatinine, Ser 1.11; Hemoglobin 14.9; Platelets 86; Potassium 4.5; Sodium 142    Lipid Panel    Component Value Date/Time   CHOL 128 08/23/2019 2215   TRIG 113 08/23/2019 2215   HDL 22 (L) 08/23/2019 2215   CHOLHDL 5.8 08/23/2019 2215   VLDL 23 08/23/2019 2215   LDLCALC 83 08/23/2019 2215      Wt Readings from Last 3 Encounters:  09/03/19 289 lb 12.8 oz (131.5 kg)  08/25/19 296 lb 6.4 oz (134.4 kg)  09/01/16 280 lb 9.6 oz (127.3 kg)      Other studies Reviewed: Additional studies/ records that were reviewed today include:  TTE 08/24/2019 1. Left ventricular ejection  fraction, by estimation, is 55%. The left  ventricle has normal function. The left ventricle has no regional wall  motion abnormalities. Left ventricular diastolic parameters were normal.  2. Right ventricular systolic function is normal. The right ventricular  size is normal. There is normal pulmonary artery systolic pressure.  3. The mitral valve is normal in structure. Trivial mitral valve  regurgitation. No evidence of mitral stenosis.  4. The aortic valve is tricuspid. Aortic valve regurgitation is trivial.  Mild aortic valve sclerosis is present, with no evidence of aortic valve  stenosis.  5. The inferior vena cava is normal in size with greater than 50%  respiratory variability, suggesting right atrial pressure of 3 mmHg.    LHC 08/24/2019  Prox RCA lesion is 40% stenosed.  Mid RCA lesion is 50% stenosed.  Mid Cx lesion is 99% stenosed.  Post intervention, there is a 0% residual stenosis.  A drug-eluting stent was successfully placed using a STENT RESOLUTE ONYX 2.5X15.  1st Diag lesion is 99% stenosed.  1. Significant two-vessel coronary artery disease involving mid left circumflex and first diagonal. 2. Mildly elevated left ventricular end-diastolic pressure at 22 mmHg. Left ventricular angiography was not performed. 3. Successful angioplasty and drug-eluting stent placement to the mid left circumflex.  Recommendations: Left radial approach was chosen given the patient's reported history of right hand ischemia in the remote past with no palpable ulnar pulse on the right. The plan was to perform PCI of the diagonal also. However, the patient had severe heartburn and uncontrollable cough after he was given 600 mg of clopidogrel. He could not hold still for the rest of the procedure and when we were finally able to sedate him, he became hypoxic from oversedation. The diagonal appears to be 2 mm in diameter. Continue medical therapy for now. If there is any  recurrent anginal symptoms, diagonal PCI can be staged for Monday after we ensure optimal control of his GERD. I increased his Protonix to twice daily.     ASSESSMENT AND PLAN:  1. CAD s/p PCI/DES to LCx with high grade stenosis in the 1st diagonal medically managed 08/2019: no  recurrent chest pain - Continue aspirin and plavix - Continue statin - Continue metoprolol  2. HLD: LDL 83 08/25/19. Started on atorvastatin 40mg  daily.  - Plan to repeat FLP/LFTs in 6 weeks for close monitoring - Continue atorvastatin  3. Thrombocytopenia: PLT 72 on the day of discharge - Will repeat CBC today for close monitoring with DAPT. - Recommended re-establishing care with his hematologist for ongoing monitoring   4. Fatty liver disease: mild transaminitis during recent admission. RUQ showed fatty liver disease - Will plan for repeat LFTs in 6 weeks for close monitoring after starting statin  5. Tobacco abuse: asking for help with quitting - Will start chantix  6. Chronic pain: advised against taking ibuprofen to minimize bleeding risk - Encouraged follow-up with his pain management provider for alternatives to ibuprofen    Current medicines are reviewed at length with the patient today.  The patient does not have concerns regarding medicines.  The following changes have been made:  As above  Labs/ tests ordered today include:   Orders Placed This Encounter  Procedures  . Basic metabolic panel  . CBC  . Lipid panel  . EKG 12-Lead     Disposition:   FU with Dr. [place] in 3 months  Signed, Flora Lipps, PA-C  09/05/2019 12:10 AM

## 2019-09-03 ENCOUNTER — Other Ambulatory Visit: Payer: Self-pay

## 2019-09-03 ENCOUNTER — Encounter: Payer: Self-pay | Admitting: Medical

## 2019-09-03 ENCOUNTER — Ambulatory Visit (INDEPENDENT_AMBULATORY_CARE_PROVIDER_SITE_OTHER): Payer: No Typology Code available for payment source | Admitting: Medical

## 2019-09-03 VITALS — BP 112/70 | HR 57 | Temp 98.2°F | Ht 74.0 in | Wt 289.8 lb

## 2019-09-03 DIAGNOSIS — E7800 Pure hypercholesterolemia, unspecified: Secondary | ICD-10-CM | POA: Diagnosis not present

## 2019-09-03 DIAGNOSIS — K760 Fatty (change of) liver, not elsewhere classified: Secondary | ICD-10-CM | POA: Diagnosis not present

## 2019-09-03 DIAGNOSIS — D696 Thrombocytopenia, unspecified: Secondary | ICD-10-CM | POA: Diagnosis not present

## 2019-09-03 DIAGNOSIS — I2510 Atherosclerotic heart disease of native coronary artery without angina pectoris: Secondary | ICD-10-CM

## 2019-09-03 DIAGNOSIS — Z720 Tobacco use: Secondary | ICD-10-CM

## 2019-09-03 MED ORDER — CHANTIX STARTING MONTH PAK 0.5 MG X 11 & 1 MG X 42 PO TABS: ORAL_TABLET | ORAL | 0 refills | Status: DC

## 2019-09-03 MED ORDER — VARENICLINE TARTRATE 1 MG PO TABS: 1.0000 mg | ORAL_TABLET | Freq: Two times a day (BID) | ORAL | 0 refills | Status: AC

## 2019-09-03 NOTE — Patient Instructions (Addendum)
Medication Instructions:   START Chantix starter pak 1- 0.5 mg tablet daily for 3 days, then increase to 1-0.5 mg tablet 2 times a day for 4 days, then increase to 1-1mg  tablet 2 times a day.  Take Chantix 1-0.5 mg tablet 2 times a day  *If you need a refill on your cardiac medications before your next appointment, please call your pharmacy*   Lab Work: Your physician recommends that you return for lab work TODAY:   BMET  CBC  Your physician recommends that you return for a FASTING lipid profile in 6 WEEKS-10/15/2019: DO NOT EAT OR DRINK PAST MIDNIGHT. (OKAY TO HAVE WATER) If you have labs (blood work) drawn today and your tests are completely normal, you will receive your results only by: Marland Kitchen MyChart Message (if you have MyChart) OR . A paper copy in the mail  If you have any lab test that is abnormal or we need to change your treatment, we will call you to review the results.  Testing/Procedures: NONE ordered at this time of appointment    Follow-Up: At [facility], you and your health needs are our priority.  As part of our continuing mission to provide you with exceptional heart care, we have created designated Provider Care Teams.  These Care Teams include your primary Cardiologist (physician) and Advanced Practice Providers (APPs -  Physician Assistants and Nurse Practitioners) who all work together to provide you with the care you need, when you need it.  We recommend signing up for the patient portal called "MyChart".  Sign up information is provided on this After Visit Summary.  MyChart is used to connect with patients for Virtual Visits (Telemedicine).  Patients are able to view lab/test results, encounter notes, upcoming appointments, etc.  Non-urgent messages can be sent to your provider as well.   To learn more about what you can do with MyChart, go to [email or web address].    Your next appointment:   3 month(s)  The format for your next appointment:   In  Person  Provider:   Lennie Odor, MD  Other Instructions   Mediterranean Diet A Mediterranean diet refers to food and lifestyle choices that are based on the traditions of countries located on the [place]. This way of eating has been shown to help prevent certain conditions and improve outcomes for people who have chronic diseases, like kidney disease and heart disease. What are tips for following this plan? Lifestyle  Cook and eat meals together with your family, when possible.  Drink enough fluid to keep your urine clear or pale yellow.  Be physically active every day. This includes: ? Aerobic exercise like running or swimming. ? Leisure activities like gardening, walking, or housework.  Get 7-8 hours of sleep each night.  If recommended by your health care provider, drink red wine in moderation. This means 1 glass a day for nonpregnant women and 2 glasses a day for men. A glass of wine equals 5 oz (150 mL). Reading food labels   Check the serving size of packaged foods. For foods such as rice and pasta, the serving size refers to the amount of cooked product, not dry.  Check the total fat in packaged foods. Avoid foods that have saturated fat or trans fats.  Check the ingredients list for added sugars, such as corn syrup. Shopping  At the grocery store, buy most of your food from the areas near the walls of the store. This includes: ? Fresh fruits and vegetables (produce). ?  Grains, beans, nuts, and seeds. Some of these may be available in unpackaged forms or large amounts (in bulk). ? Fresh seafood. ? Poultry and eggs. ? Low-fat dairy products.  Buy whole ingredients instead of prepackaged foods.  Buy fresh fruits and vegetables in-season from local farmers markets.  Buy frozen fruits and vegetables in resealable bags.  If you do not have access to quality fresh seafood, buy precooked frozen shrimp or canned fish, such as tuna, salmon, or sardines.  Buy  small amounts of raw or cooked vegetables, salads, or olives from the deli or salad bar at your store.  Stock your pantry so you always have certain foods on hand, such as olive oil, canned tuna, canned tomatoes, rice, pasta, and beans. Cooking  Cook foods with extra-virgin olive oil instead of using butter or other vegetable oils.  Have meat as a side dish, and have vegetables or grains as your main dish. This means having meat in small portions or adding small amounts of meat to foods like pasta or stew.  Use beans or vegetables instead of meat in common dishes like chili or lasagna.  Experiment with different cooking methods. Try roasting or broiling vegetables instead of steaming or sauteing them.  Add frozen vegetables to soups, stews, pasta, or rice.  Add nuts or seeds for added healthy fat at each meal. You can add these to yogurt, salads, or vegetable dishes.  Marinate fish or vegetables using olive oil, lemon juice, garlic, and fresh herbs. Meal planning   Plan to eat 1 vegetarian meal one day each week. Try to work up to 2 vegetarian meals, if possible.  Eat seafood 2 or more times a week.  Have healthy snacks readily available, such as: ? Vegetable sticks with hummus. ? [place] yogurt. ? Fruit and nut trail mix.  Eat balanced meals throughout the week. This includes: ? Fruit: 2-3 servings a day ? Vegetables: 4-5 servings a day ? Low-fat dairy: 2 servings a day ? Fish, poultry, or lean meat: 1 serving a day ? Beans and legumes: 2 or more servings a week ? Nuts and seeds: 1-2 servings a day ? Whole grains: 6-8 servings a day ? Extra-virgin olive oil: 3-4 servings a day  Limit red meat and sweets to only a few servings a month What are my food choices?  Mediterranean diet ? Recommended  Grains: Whole-grain pasta. Brown rice. Bulgar wheat. Polenta. Couscous. Whole-wheat bread. [place].  Vegetables: Artichokes. Beets. Broccoli. Cabbage. Carrots. Eggplant.  Green beans. Chard. Kale. Spinach. Onions. Leeks. Peas. Squash. Tomatoes. Peppers. Radishes.  Fruits: Apples. Apricots. Avocado. Berries. Bananas. Cherries. Dates. Figs. Grapes. Lemons. Melon. Oranges. Peaches. Plums. Pomegranate.  Meats and other protein foods: Beans. Almonds. Sunflower seeds. Pine nuts. Peanuts. [place]. Salmon. Scallops. Shrimp. [place]. Tilapia. Clams. Oysters. Eggs.  Dairy: Low-fat milk. Cheese. Greek yogurt.  Beverages: Water. Red wine. Herbal tea.  Fats and oils: Extra virgin olive oil. Avocado oil. Grape seed oil.  Sweets and desserts: [place] yogurt with honey. Baked apples. Poached pears. Trail mix.  Seasoning and other foods: Basil. Cilantro. Coriander. Cumin. Mint. Parsley. Sage. Rosemary. Tarragon. Garlic. Oregano. Thyme. Pepper. Balsalmic vinegar. Tahini. Hummus. Tomato sauce. Olives. Mushrooms. ? Limit these  Grains: Prepackaged pasta or rice dishes. Prepackaged cereal with added sugar.  Vegetables: Deep fried potatoes (french fries).  Fruits: Fruit canned in syrup.  Meats and other protein foods: Beef. Pork. Lamb. Poultry with skin. Hot dogs. Berniece Salines.  Dairy: Ice cream. Sour cream. Whole milk.  Beverages:  Juice. Sugar-sweetened soft drinks. Beer. Liquor and spirits.  Fats and oils: Butter. Canola oil. Vegetable oil. Beef fat (tallow). Lard.  Sweets and desserts: Cookies. Cakes. Pies. Candy.  Seasoning and other foods: Mayonnaise. Premade sauces and marinades. The items listed may not be a complete list. Talk with your dietitian about what dietary choices are right for you. Summary  The Mediterranean diet includes both food and lifestyle choices.  Eat a variety of fresh fruits and vegetables, beans, nuts, seeds, and whole grains.  Limit the amount of red meat and sweets that you eat.  Talk with your health care provider about whether it is safe for you to drink red wine in moderation. This means 1 glass a day for nonpregnant women and 2 glasses a day  for men. A glass of wine equals 5 oz (150 mL). This information is not intended to replace advice given to you by your health care provider. Make sure you discuss any questions you have with your health care provider. Document Revised: 12/25/2015 Document Reviewed: 12/18/2015 Elsevier Patient Education  2020 [employer].   Coping with Quitting Smoking  Quitting smoking is a physical and mental challenge. You will face cravings, withdrawal symptoms, and temptation. Before quitting, work with your health care provider to make a plan that can help you cope. Preparation can help you quit and keep you from giving in. How can I cope with cravings? Cravings usually last for 5-10 minutes. If you get through it, the craving will pass. Consider taking the following actions to help you cope with cravings: Keep your mouth busy: Chew sugar-free gum. Suck on hard candies or a straw. Brush your teeth. Keep your hands and body busy: Immediately change to a different activity when you feel a craving. Squeeze or play with a ball. Do an activity or a hobby, like making bead jewelry, practicing needlepoint, or working with wood. Mix up your normal routine. Take a short exercise break. Go for a quick walk or run up and down stairs. Spend time in public places where smoking is not allowed. Focus on doing something kind or helpful for someone else. Call a friend or family member to talk during a craving. Join a support group. Call a quit line, such as 1-800-QUIT-NOW. Talk with your health care provider about medicines that might help you cope with cravings and make quitting easier for you. How can I deal with withdrawal symptoms? Your body may experience negative effects as it tries to get used to not having nicotine in the system. These effects are called withdrawal symptoms. They may include: Feeling hungrier than normal. Trouble concentrating. Irritability. Trouble sleeping. Feeling  depressed. Restlessness and agitation. Craving a cigarette. To manage withdrawal symptoms: Avoid places, people, and activities that trigger your cravings. Remember why you want to quit. Get plenty of sleep. Avoid coffee and other caffeinated drinks. These may worsen some of your symptoms. How can I handle social situations? Social situations can be difficult when you are quitting smoking, especially in the first few weeks. To manage this, you can: Avoid parties, bars, and other social situations where people might be smoking. Avoid alcohol. Leave right away if you have the urge to smoke. Explain to your family and friends that you are quitting smoking. Ask for understanding and support. Plan activities with friends or family where smoking is not an option. What are some ways I can cope with stress? Wanting to smoke may cause stress, and stress can make you want to smoke.  Find ways to manage your stress. Relaxation techniques can help. For example: Breathe slowly and deeply, in through your nose and out through your mouth. Listen to soothing, relaxing music. Talk with a family member or friend about your stress. Light a candle. Soak in a bath or take a shower. Think about a peaceful place. What are some ways I can prevent weight gain? Be aware that many people gain weight after they quit smoking. However, not everyone does. To keep from gaining weight, have a plan in place before you quit and stick to the plan after you quit. Your plan should include: Having healthy snacks. When you have a craving, it may help to: Eat plain popcorn, crunchy carrots, celery, or other cut vegetables. Chew sugar-free gum. Changing how you eat: Eat small portion sizes at meals. Eat 4-6 small meals throughout the day instead of 1-2 large meals a day. Be mindful when you eat. Do not watch television or do other things that might distract you as you eat. Exercising regularly: Make time to exercise each day.  If you do not have time for a long workout, do short bouts of exercise for 5-10 minutes several times a day. Do some form of strengthening exercise, like weight lifting, and some form of aerobic exercise, like running or swimming. Drinking plenty of water or other low-calorie or no-calorie drinks. Drink 6-8 glasses of water daily, or as much as instructed by your health care provider. Summary Quitting smoking is a physical and mental challenge. You will face cravings, withdrawal symptoms, and temptation to smoke again. Preparation can help you as you go through these challenges. You can cope with cravings by keeping your mouth busy (such as by chewing gum), keeping your body and hands busy, and making calls to family, friends, or a helpline for people who want to quit smoking. You can cope with withdrawal symptoms by avoiding places where people smoke, avoiding drinks with caffeine, and getting plenty of rest. Ask your health care provider about the different ways to prevent weight gain, avoid stress, and handle social situations. This information is not intended to replace advice given to you by your health care provider. Make sure you discuss any questions you have with your health care provider. Document Revised: 04/08/2017 Document Reviewed: 04/23/2016 Elsevier Patient Education  2020 [employer].

## 2019-09-04 LAB — BASIC METABOLIC PANEL
BUN/Creatinine Ratio: 5 — ABNORMAL LOW (ref 9–20)
BUN: 6 mg/dL (ref 6–24)
CO2: 25 mmol/L (ref 20–29)
Calcium: 9.2 mg/dL (ref 8.7–10.2)
Chloride: 104 mmol/L (ref 96–106)
Creatinine, Ser: 1.11 mg/dL (ref 0.76–1.27)
GFR calc Af Amer: 88 mL/min/{1.73_m2} (ref >59)
GFR calc non Af Amer: 76 mL/min/{1.73_m2} (ref >59)
Glucose: 67 mg/dL (ref 65–99)
Potassium: 4.5 mmol/L (ref 3.5–5.2)
Sodium: 142 mmol/L (ref 134–144)

## 2019-09-04 LAB — CBC
Hematocrit: 43.9 % (ref 37.5–51.0)
Hemoglobin: 14.9 g/dL (ref 13.0–17.7)
MCH: 33.4 pg — ABNORMAL HIGH (ref 26.6–33.0)
MCHC: 33.9 g/dL (ref 31.5–35.7)
MCV: 98 fL — ABNORMAL HIGH (ref 79–97)
Platelets: 86 10*3/uL — CL (ref 150–450)
RBC: 4.46 x10E6/uL (ref 4.14–5.80)
RDW: 14.5 % (ref 11.6–15.4)
WBC: 6.9 10*3/uL (ref 3.4–10.8)

## 2019-09-05 ENCOUNTER — Telehealth: Payer: Self-pay

## 2019-09-05 NOTE — Telephone Encounter (Addendum)
Left a detailed message for the patient and informed him if he has any questions to give our office a call.  ----- Message from Beatriz Stallion, PA-C sent at 09/05/2019 12:32 AM EDT ----- Please notify the patient that his blood work is stable from his hospital stay, including his kidney function and platelet levels. He should continue his current medications. Thanks!

## 2019-09-10 ENCOUNTER — Telehealth (HOSPITAL_COMMUNITY): Payer: Self-pay

## 2019-09-10 ENCOUNTER — Encounter (HOSPITAL_COMMUNITY): Payer: Self-pay

## 2019-09-10 NOTE — Telephone Encounter (Signed)
Attempted to call patient in regards to Cardiac Rehab - LM on VM Mailed letter 

## 2019-09-24 ENCOUNTER — Ambulatory Visit: Payer: No Typology Code available for payment source | Admitting: Cardiovascular Disease

## 2019-09-25 ENCOUNTER — Telehealth (HOSPITAL_COMMUNITY): Payer: Self-pay

## 2019-09-25 NOTE — Telephone Encounter (Signed)
No response from pt regarding CR.  Closed referral.  

## 2019-09-30 NOTE — Progress Notes (Signed)
Cardiology Office Note:   Date:  10/02/2019  NAME:  Kenneth Mckee    MRN: [id number] DOB:  03-23-1966   PCP:  System, Pcp Not In  Cardiologist:  Reatha Harps, MD  Electrophysiologist:  None   Referring MD: Floreen Comber, *   Chief Complaint  Patient presents with  . Coronary Artery Disease   History of Present Illness:   Kenneth Mckee is a 54 y.o. male with a hx of CAD, GERD, fatty liver, tobacco abuse who presents for follow-up. Recent admission 08/23/2019 for NSTEMI with PCI to mid LCX. 99% D1 lesion but unable to complete PCI due to uncontrolled GERD during the case. He opted for medical management.   He reports he is doing well.  He is walking up to 1/2 to 1 mile per day without any symptoms of chest pain.  He reports he can occasionally, 1-2 times per week experience a pinching pain in his center chest.  He reports he describes sharp to last seconds.  It can occur at rest or any time.  It is really not that bothersome for him.  He reports he is working on his diet.  He does still drink sodas and is cutting down his salt intake.  He also is going to work on his carbohydrate intake.  He is still smoking as well.  Less than 1 pack/day.  He does realize he needs to quit.  Pressure well controlled today.  He does have obstructive sleep apnea and wears his machine.  He describes no major symptoms.  He reports no bleeding issues on aspirin or Plavix.  We do need a repeat cholesterol profile and he will give this to [place] either this week or next week.  He is not fasting today.  He denies chest pain, shortness of breath or palpitation today.  Problem List 1. NSTEMI 08/23/2019 -PCI to mid LCX -99% D1 lesion but small vessel and declined staged PCI 2. GERD 3. Fatty liver 4. Thrombocytopenia 5. Obesity/OSA 6. Tobacco abuse   Past Medical History: Past Medical History:  Diagnosis Date  . Arm vein blood clot    right arm  . Arthritis   . Chickenpox   . GERD (gastroesophageal reflux  disease)   . NSTEMI (non-ST elevated myocardial infarction) (HCC) 08/23/2019  . Phlebitis    right wrist artery- so small. Artery so far down  no surgery indicated. Was on coumadin for 6 months in 2004  . Sleep apnea 2004-2006   sleep study (severe)' wears BiPap nightly  . Umbilical hernia     Past Surgical History: Past Surgical History:  Procedure Laterality Date  . ANTERIOR CERVICAL DECOMP/DISCECTOMY FUSION N/A 02/13/2013   Procedure: ANTERIOR CERVICAL DECOMPRESSION/DISCECTOMY FUSION Cervical Three-Four, Four-Five, Five-Six ;  Surgeon: Temple Pacini, MD;  Location: [facility];  Service: Neurosurgery;  Laterality: N/A;  ANTERIOR CERVICAL DECOMPRESSION/DISCECTOMY FUSION 3 LEVELS  . CERVICAL DISCECTOMY  2005  . CHOLECYSTECTOMY N/A 09/03/2012   Procedure: LAPAROSCOPIC CHOLECYSTECTOMY WITH INTRAOPERATIVE CHOLANGIOGRAM;  Surgeon: Liz Malady, MD;  Location: [facility] OR;  Service: General;  Laterality: N/A;  . HERNIA REPAIR  2013   epigastric hernia repair w/mesh  . LEFT HEART CATH AND CORONARY ANGIOGRAPHY N/A 08/24/2019   Procedure: LEFT HEART CATH AND CORONARY ANGIOGRAPHY;  Surgeon: Iran Ouch, MD;  Location: [facility];  Service: Cardiovascular;  Laterality: N/A;  . NASAL SINUS SURGERY    . TONSILLECTOMY    . UPPER GI ENDOSCOPY  Current Medications: Current Meds  Medication Sig  . aspirin 81 MG chewable tablet Chew 1 tablet (81 mg total) by mouth daily.  Marland Kitchen atorvastatin (LIPITOR) 40 MG tablet Take 1 tablet (40 mg total) by mouth daily at 6 PM.  . Benzoyl Peroxide 10 % LIQD Apply 1 application topically daily. Use to wash affected area  . busPIRone (BUSPAR) 15 MG tablet Take 7.5 mg by mouth as needed.   . Cholecalciferol 50 MCG (2000 UT) TABS Take 1 tablet by mouth daily.  . clindamycin (CLEOCIN T) 1 % external solution Apply 1 application topically daily.  . clopidogrel (PLAVIX) 75 MG tablet Take 1 tablet (75 mg total) by mouth daily with breakfast.  . cyclobenzaprine  (FLEXERIL) 10 MG tablet Take 10 mg by mouth as needed for muscle spasms.  Marland Kitchen doxycycline (MONODOX) 100 MG capsule Take 100 mg by mouth as needed (for flare ups).   . escitalopram (LEXAPRO) 10 MG tablet Take 10 mg by mouth 3 (three) times daily.   Marland Kitchen gabapentin (NEURONTIN) 300 MG capsule Take 300 mg by mouth as needed for pain.  . Meclizine HCl 25 MG CHEW Chew 25 mg by mouth as needed for dizziness.  . methocarbamol (ROBAXIN) 500 MG tablet Take 500 mg by mouth as needed for muscle spasms.  . metoprolol tartrate (LOPRESSOR) 25 MG tablet Take 0.5 tablets (12.5 mg total) by mouth 2 (two) times daily.  . Multiple Vitamins-Iron TABS Take 1 tablet by mouth daily.  . nitroGLYCERIN (NITROSTAT) 0.4 MG SL tablet Place 1 tablet (0.4 mg total) under the tongue every 5 (five) minutes x 3 doses as needed for chest pain.  . pantoprazole (PROTONIX) 40 MG tablet Take 40 mg by mouth.  . promethazine (PHENERGAN) 25 MG tablet Take 25 mg by mouth as needed for nausea.  . rizatriptan (MAXALT) 5 MG tablet Take 2.5 mg by mouth as needed for migraine.  . varenicline (CHANTIX CONTINUING MONTH PAK) 1 MG tablet Take 1 tablet (1 mg total) by mouth 2 (two) times daily.  . varenicline (CHANTIX STARTING MONTH PAK) 0.5 MG X 11 & 1 MG X 42 tablet Take one 0.5 mg tablet by mouth once daily for 3 days, then increase to one 0.5 mg tablet twice daily for 4 days, then increase to one 1 mg tablet twice daily.  . [DISCONTINUED] aspirin 81 MG chewable tablet Chew 1 tablet (81 mg total) by mouth daily.  . [DISCONTINUED] atorvastatin (LIPITOR) 40 MG tablet Take 1 tablet (40 mg total) by mouth daily at 6 PM.  . [DISCONTINUED] clopidogrel (PLAVIX) 75 MG tablet Take 1 tablet (75 mg total) by mouth daily with breakfast.  . [DISCONTINUED] metoprolol tartrate (LOPRESSOR) 25 MG tablet Take 0.5 tablets (12.5 mg total) by mouth 2 (two) times daily.     Allergies:    Penicillins and Sumatriptan   Social History: Social History   Socioeconomic  History  . Marital status: Married    Spouse name: Not on file  . Number of children: 1  . Years of education: Not on file  . Highest education level: Not on file  Occupational History  . Occupation: Statistician: [employer]  Tobacco Use  . Smoking status: Current Every Day Smoker    Packs/day: 1.00    Years: 25.00    Pack years: 25.00    Types: Cigarettes  . Smokeless tobacco: Never Used  Substance and Sexual Activity  . 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 Drug use: No    Comment: remote marijuana use   . Sexual activity: Not on file  Other Topics Concern  . Not on file  Social History Narrative   Regular exercise - no    Social Determinants of Health   Financial Resource Strain:   . Difficulty of Paying Living Expenses:   Food Insecurity:   . Worried About Programme researcher, broadcasting/film/videounning Out of Food in the Last Year:   . Baristaan Out of Food in the Last Year:   Transportation Needs:   . Freight forwarderLack of Transportation (Medical):   Marland Kitchen. Lack of Transportation (Non-Medical):   Physical Activity:   . Days of Exercise per Week:   . Minutes of Exercise per Session:   Stress:   . Feeling of Stress :   Social Connections:   . Frequency of Communication with Friends and Family:   . Frequency of Social Gatherings with Friends and Family:   . Attends Religious Services:   . Active Member of Clubs or Organizations:   . Attends BankerClub or Organization Meetings:   Marland Kitchen. Marital Status:      Family History: The patient's family history includes Heart attack (age of onset: 5054) in his father; Leukemia in his paternal grandfather.  ROS:   All other ROS reviewed and negative. Pertinent positives noted in the HPI.     EKGs/Labs/Other Studies Reviewed:   The following studies were personally reviewed by me today:   TTE 08/24/2019 1. Left ventricular ejection fraction, by estimation, is 55%. The left  ventricle has normal function. The left ventricle has no regional  wall  motion abnormalities. Left ventricular diastolic parameters were normal.  2. Right ventricular systolic function is normal. The right ventricular  size is normal. There is normal pulmonary artery systolic pressure.  3. The mitral valve is normal in structure. Trivial mitral valve  regurgitation. No evidence of mitral stenosis.  4. The aortic valve is tricuspid. Aortic valve regurgitation is trivial.  Mild aortic valve sclerosis is present, with no evidence of aortic valve  stenosis.  5. The inferior vena cava is normal in size with greater than 50%  respiratory variability, suggesting right atrial pressure of 3 mmHg.   LHC 08/24/2019  Prox RCA lesion is 40% stenosed.  Mid RCA lesion is 50% stenosed.  Mid Cx lesion is 99% stenosed.  Post intervention, there is a 0% residual stenosis.  A drug-eluting stent was successfully placed using a STENT RESOLUTE ONYX 2.5X15.  1st Diag lesion is 99% stenosed.   1.  Significant two-vessel coronary artery disease involving mid left circumflex and first diagonal. 2.  Mildly elevated left ventricular end-diastolic pressure at 22 mmHg.  Left ventricular angiography was not performed. 3.  Successful angioplasty and drug-eluting stent placement to the mid left circumflex.  Recommendations: Left radial approach was chosen given the patient's reported history of right hand ischemia in the remote past with no palpable ulnar pulse on the right. The plan was to perform PCI of the diagonal also.  However, the patient had severe heartburn and uncontrollable cough after he was given 600 mg of clopidogrel.  He could not hold still for the rest of the procedure and when we were finally able to sedate him, he became hypoxic from oversedation. The diagonal appears to be 2 mm in diameter.  Continue medical therapy for now.  If there is any recurrent anginal symptoms, diagonal PCI can be staged for Monday after we ensure optimal control  of his GERD.  I  increased his Protonix to twice daily.  Recent Labs: 08/23/2019: TSH 1.718 08/24/2019: ALT 59 09/03/2019: BUN 6; Creatinine, Ser 1.11; Hemoglobin 14.9; Platelets 86; Potassium 4.5; Sodium 142   Recent Lipid Panel    Component Value Date/Time   CHOL 128 08/23/2019 2215   TRIG 113 08/23/2019 2215   HDL 22 (L) 08/23/2019 2215   CHOLHDL 5.8 08/23/2019 2215   VLDL 23 08/23/2019 2215   LDLCALC 83 08/23/2019 2215    Physical Exam:   VS:  BP 126/78   Pulse 62   Ht 6\' 2"  (1.88 m)   Wt 289 lb 12.8 oz (131.5 kg)   SpO2 96%   BMI 37.21 kg/m    Wt Readings from Last 3 Encounters:  10/02/19 289 lb 12.8 oz (131.5 kg)  09/03/19 289 lb 12.8 oz (131.5 kg)  08/25/19 296 lb 6.4 oz (134.4 kg)    General: Well nourished, well developed, in no acute distress Heart: Atraumatic, normal size  Eyes: PEERLA, EOMI  Neck: Supple, no JVD Endocrine: No thryomegaly Cardiac: Normal S1, S2; RRR; no murmurs, rubs, or gallops Lungs: Clear to auscultation bilaterally, no wheezing, rhonchi or rales  Abd: Soft, nontender, no hepatomegaly  Ext: No edema, pulses 2+ Musculoskeletal: No deformities, BUE and BLE strength normal and equal Skin: Warm and dry, no rashes   Neuro: Alert and oriented to person, place, time, and situation, CNII-XII grossly intact, no focal deficits  Psych: Normal mood and affect   ASSESSMENT:   HANCEL ION is a 54 y.o. male who presents for the following: 1. Coronary artery disease involving native coronary artery of native heart without angina pectoris   2. Pure hypercholesterolemia   3. Tobacco abuse   4. Thrombocytopenia (HCC)   5. Hyperlipidemia, unspecified hyperlipidemia type     PLAN:   1. Coronary artery disease involving native coronary artery of native heart without angina pectoris -NSTEMI 08/24/2019. PCI to mid LCX, High grade lesion in small D1 managed medically. Had uncontrolled GERD and D1 intervention deferred. He opted for medical treatment given lack of symptoms  and small vessel.  -ASA/plavix for 1 year.  He will avoid any elective surgery.  I did discuss with him that 6 months would be acceptable to stop DAPT if he did need some procedure.  None are on the horizon. -Continue Lipitor 40 mg daily.  He does need a repeat lipid profile.  He will do this in the next few days.  He is not fasting today.  Goal LDL cholesterol less than 70. -We will continue metoprolol tartrate 12.5 twice daily.  No symptoms of angina today.  He is doing well.  2. Pure hypercholesterolemia -LDL 83.  Continue Lipitor 40 mg daily.  He will repeat a lipid profile sometime this week.  3. Tobacco abuse -Counseled on smoking cessation today.  Still 1 pack/day.  Needs to quit.  4. Thrombocytopenia (HCC) -No issues with bleeding.  We will monitor this.  Disposition: Return in about 5 months (around 03/03/2020).  Medication Adjustments/Labs and Tests Ordered: Current medicines are reviewed at length with the patient today.  Concerns regarding medicines are outlined above.  Orders Placed This Encounter  Procedures  . Lipid panel   Meds ordered this encounter  Medications  . aspirin 81 MG chewable tablet    Sig: Chew 1 tablet (81 mg total) by mouth daily.    Dispense:  90 tablet    Refill:  3  . clopidogrel (PLAVIX) 75  MG tablet    Sig: Take 1 tablet (75 mg total) by mouth daily with breakfast.    Dispense:  90 tablet    Refill:  3  . atorvastatin (LIPITOR) 40 MG tablet    Sig: Take 1 tablet (40 mg total) by mouth daily at 6 PM.    Dispense:  30 tablet    Refill:  6  . metoprolol tartrate (LOPRESSOR) 25 MG tablet    Sig: Take 0.5 tablets (12.5 mg total) by mouth 2 (two) times daily.    Dispense:  60 tablet    Refill:  6    Patient Instructions  Medication Instructions:  The current medical regimen is effective;  continue present plan and medications.  *If you need a refill on your cardiac medications before your next appointment, please call your  pharmacy*   Lab Work: LIPID (come back to the office, fasting, nothing to eat or drink, no appointment needed)   If you have labs (blood work) drawn today and your tests are completely normal, you will receive your results only by: Marland Kitchen MyChart Message (if you have MyChart) OR . A paper copy in the mail If you have any lab test that is abnormal or we need to change your treatment, we will call you to review the results.   Follow-Up: At [facility], you and your health needs are our priority.  As part of our continuing mission to provide you with exceptional heart care, we have created designated Provider Care Teams.  These Care Teams include your primary Cardiologist (physician) and Advanced Practice Providers (APPs -  Physician Assistants and Nurse Practitioners) who all work together to provide you with the care you need, when you need it.  We recommend signing up for the patient portal called "MyChart".  Sign up information is provided on this After Visit Summary.  MyChart is used to connect with patients for Virtual Visits (Telemedicine).  Patients are able to view lab/test results, encounter notes, upcoming appointments, etc.  Non-urgent messages can be sent to your provider as well.   To learn more about what you can do with MyChart, go to [email or web address].    Your next appointment:   5 month(s)  The format for your next appointment:   In Person  Provider:   Eleonore Chiquito, MD         Time Spent with Patient: I have spent a total of 35 minutes with patient reviewing hospital notes, telemetry, EKGs, labs and examining the patient as well as establishing an assessment and plan that was discussed with the patient.  > 50% of time was spent in direct patient care.  Signed, Addison Naegeli. Audie Box, [place]  [address], [place], [place] [address] [phone]  10/02/2019 9:44 AM

## 2019-10-02 ENCOUNTER — Encounter: Payer: Self-pay | Admitting: Cardiovascular Disease

## 2019-10-02 ENCOUNTER — Ambulatory Visit (INDEPENDENT_AMBULATORY_CARE_PROVIDER_SITE_OTHER): Payer: No Typology Code available for payment source | Admitting: Cardiovascular Disease

## 2019-10-02 ENCOUNTER — Other Ambulatory Visit: Payer: Self-pay

## 2019-10-02 VITALS — BP 126/78 | HR 62 | Ht 74.0 in | Wt 289.8 lb

## 2019-10-02 DIAGNOSIS — Z720 Tobacco use: Secondary | ICD-10-CM

## 2019-10-02 DIAGNOSIS — D696 Thrombocytopenia, unspecified: Secondary | ICD-10-CM

## 2019-10-02 DIAGNOSIS — E7800 Pure hypercholesterolemia, unspecified: Secondary | ICD-10-CM | POA: Diagnosis not present

## 2019-10-02 DIAGNOSIS — I2510 Atherosclerotic heart disease of native coronary artery without angina pectoris: Secondary | ICD-10-CM | POA: Diagnosis not present

## 2019-10-02 DIAGNOSIS — E785 Hyperlipidemia, unspecified: Secondary | ICD-10-CM

## 2019-10-02 MED ORDER — CLOPIDOGREL BISULFATE 75 MG PO TABS: 75.0000 mg | ORAL_TABLET | Freq: Every day | ORAL | 3 refills | Status: DC

## 2019-10-02 MED ORDER — ATORVASTATIN CALCIUM 40 MG PO TABS: 40.0000 mg | ORAL_TABLET | Freq: Every day | ORAL | 6 refills | Status: DC

## 2019-10-02 MED ORDER — METOPROLOL TARTRATE 25 MG PO TABS: 12.5000 mg | ORAL_TABLET | Freq: Two times a day (BID) | ORAL | 6 refills | Status: AC

## 2019-10-02 MED ORDER — ASPIRIN 81 MG PO CHEW: 81.0000 mg | CHEWABLE_TABLET | Freq: Every day | ORAL | 3 refills | Status: AC

## 2019-10-02 NOTE — Patient Instructions (Signed)
Medication Instructions:  The current medical regimen is effective;  continue present plan and medications.  *If you need a refill on your cardiac medications before your next appointment, please call your pharmacy*   Lab Work: LIPID (come back to the office, fasting, nothing to eat or drink, no appointment needed)   If you have labs (blood work) drawn today and your tests are completely normal, you will receive your results only by: Marland Kitchen MyChart Message (if you have MyChart) OR . A paper copy in the mail If you have any lab test that is abnormal or we need to change your treatment, we will call you to review the results.   Follow-Up: At [facility], you and your health needs are our priority.  As part of our continuing mission to provide you with exceptional heart care, we have created designated Provider Care Teams.  These Care Teams include your primary Cardiologist (physician) and Advanced Practice Providers (APPs -  Physician Assistants and Nurse Practitioners) who all work together to provide you with the care you need, when you need it.  We recommend signing up for the patient portal called "MyChart".  Sign up information is provided on this After Visit Summary.  MyChart is used to connect with patients for Virtual Visits (Telemedicine).  Patients are able to view lab/test results, encounter notes, upcoming appointments, etc.  Non-urgent messages can be sent to your provider as well.   To learn more about what you can do with MyChart, go to [email or web address].    Your next appointment:   5 month(s)  The format for your next appointment:   In Person  Provider:   Lennie Odor, MD

## 2019-10-19 ENCOUNTER — Telehealth: Payer: Self-pay | Admitting: Cardiovascular Disease

## 2019-10-19 NOTE — Telephone Encounter (Signed)
Spoke with pt, since his MI he has noticed and increase in his joint pain. He was given the okay to stop the atorvastatin for 2 weeks to see if symptoms improve. He will let us know in 2 weeks how he is feeling. He has never taken any cholesterol medications before. Pt agreed with this plan.

## 2019-10-19 NOTE — Telephone Encounter (Signed)
New Message:    Please call, questions about his medicine. He said he have been having some knee pain and was wondering if one of his medicine might be causing this.

## 2019-11-27 ENCOUNTER — Telehealth: Payer: Self-pay | Admitting: Cardiovascular Disease

## 2019-11-27 NOTE — Telephone Encounter (Signed)
Called patient, advised of message from PharmD.  Patient verbalized understanding.   

## 2019-11-27 NOTE — Telephone Encounter (Signed)
Patient  is on chonic plavix and aspirin. Will recommend against chronic celebrex therapy.   Okay to take of up to 3 days but will need alternative therapy for long term arthritis management. Salonpa patches, voltaren gel or any other topical agent preferred.

## 2019-11-27 NOTE — Telephone Encounter (Signed)
Will route to PharmD to advise. Thanks!   

## 2019-11-27 NOTE — Telephone Encounter (Signed)
Pt c/o medication issue:  1. Name of Medication: Celebrex  2. How are you currently taking this medication (dosage and times per day)? n/a  3. Are you having a reaction (difficulty breathing--STAT)? No  4. What is your medication issue? Patient wants to know if he can start using this again for his arthritis.

## 2019-12-04 NOTE — Progress Notes (Deleted)
Cardiology Office Note:   Date:  12/04/2019  NAME:  Kenneth Mckee    MRN: [id number] DOB:  10/30/1965   PCP:  System, Pcp Not In  Cardiologist:  Reatha Harps, MD  Electrophysiologist:  None   Referring MD: No ref. provider found   No chief complaint on file. ***  History of Present Illness:   Kenneth Mckee is a 54 y.o. male with a hx of CAD, GERD, fatty liver, tobacco abuse who presents for follow-up. NSTEMI 08/2019 with PCI to mid LCX. Small D1 branch with significant disease but declined staged PCI. Needs repeat lipid profile.   Problem List 1. NSTEMI 08/23/2019 -PCI to mid LCX -99% D1 lesion but small vessel and declined staged PCI 2. GERD 3. Fatty liver 4. Thrombocytopenia -chronic  5. Obesity/OSA 6. Tobacco abuse  7. HLD -T chol 128, LDL 83, HDL 22, TG 113  Past Medical History: Past Medical History:  Diagnosis Date  . Arm vein blood clot    right arm  . Arthritis   . Chickenpox   . GERD (gastroesophageal reflux disease)   . NSTEMI (non-ST elevated myocardial infarction) (HCC) 08/23/2019  . Phlebitis    right wrist artery- so small. Artery so far down  no surgery indicated. Was on coumadin for 6 months in 2004  . Sleep apnea 2004-2006   sleep study (severe)' wears BiPap nightly  . Umbilical hernia     Past Surgical History: Past Surgical History:  Procedure Laterality Date  . ANTERIOR CERVICAL DECOMP/DISCECTOMY FUSION N/A 02/13/2013   Procedure: ANTERIOR CERVICAL DECOMPRESSION/DISCECTOMY FUSION Cervical Three-Four, Four-Five, Five-Six ;  Surgeon: Temple Pacini, MD;  Location: [facility];  Service: Neurosurgery;  Laterality: N/A;  ANTERIOR CERVICAL DECOMPRESSION/DISCECTOMY FUSION 3 LEVELS  . CERVICAL DISCECTOMY  2005  . CHOLECYSTECTOMY N/A 09/03/2012   Procedure: LAPAROSCOPIC CHOLECYSTECTOMY WITH INTRAOPERATIVE CHOLANGIOGRAM;  Surgeon: Liz Malady, MD;  Location: [facility];  Service: General;  Laterality: N/A;  . HERNIA REPAIR  2013   epigastric hernia  repair w/mesh  . LEFT HEART CATH AND CORONARY ANGIOGRAPHY N/A 08/24/2019   Procedure: LEFT HEART CATH AND CORONARY ANGIOGRAPHY;  Surgeon: Iran Ouch, MD;  [facility];  Service: Cardiovascular;  Laterality: N/A;  . NASAL SINUS SURGERY    . TONSILLECTOMY    . UPPER GI ENDOSCOPY      Current Medications: No outpatient medications have been marked as taking for the 12/05/19 encounter (Appointment) with O'Neal, Ronnald Ramp, MD.     Allergies:    Penicillins and Sumatriptan   Social History: Social History   Socioeconomic History  . Marital status: Married    Spouse name: Not on file  . Number of children: 1  . Years of education: Not on file  . Highest education level: Not on file  Occupational History  . Occupation: Statistician: [employer]  Tobacco Use  . Smoking status: Current Every Day Smoker    Packs/day: 1.00    Years: 25.00    Pack years: 25.00    Types: Cigarettes  . Smokeless tobacco: Never Used  Substance and Sexual Activity  . 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 Drug use: No    Comment: remote marijuana use   . Sexual activity: Not on file  Other Topics Concern  . Not on file  Social History Narrative   Regular exercise - no    Social  Determinants of Health   Financial Resource Strain:   . Difficulty of Paying Living Expenses:   Food Insecurity:   . Worried About Programme researcher, broadcasting/film/video in the Last Year:   . Barista in the Last Year:   Transportation Needs:   . Freight forwarder (Medical):   Marland Kitchen Lack of Transportation (Non-Medical):   Physical Activity:   . Days of Exercise per Week:   . Minutes of Exercise per Session:   Stress:   . Feeling of Stress :   Social Connections:   . Frequency of Communication with Friends and Family:   . Frequency of Social Gatherings with Friends and Family:   . Attends Religious Services:   . Active Member of Clubs or Organizations:    . Attends Banker Meetings:   Marland Kitchen Marital Status:      Family History: The patient's ***family history includes Heart attack (age of onset: 23) in his father; Leukemia in his paternal grandfather.  ROS:   All other ROS reviewed and negative. Pertinent positives noted in the HPI.     EKGs/Labs/Other Studies Reviewed:   The following studies were personally reviewed by me today:  EKG:  EKG is *** ordered today.  The ekg ordered today demonstrates ***, and was personally reviewed by me.   TTE 08/24/2019 1. Left ventricular ejection fraction, by estimation, is 55%. The left  ventricle has normal function. The left ventricle has no regional wall  motion abnormalities. Left ventricular diastolic parameters were normal.  2. Right ventricular systolic function is normal. The right ventricular  size is normal. There is normal pulmonary artery systolic pressure.  3. The mitral valve is normal in structure. Trivial mitral valve  regurgitation. No evidence of mitral stenosis.  4. The aortic valve is tricuspid. Aortic valve regurgitation is trivial.  Mild aortic valve sclerosis is present, with no evidence of aortic valve  stenosis.  5. The inferior vena cava is normal in size with greater than 50%  respiratory variability, suggesting right atrial pressure of 3 mmHg.   LHC 08/24/2019  Prox RCA lesion is 40% stenosed.  Mid RCA lesion is 50% stenosed.  Mid Cx lesion is 99% stenosed.  Post intervention, there is a 0% residual stenosis.  A drug-eluting stent was successfully placed using a STENT RESOLUTE ONYX 2.5X15.  1st Diag lesion is 99% stenosed.   1.  Significant two-vessel coronary artery disease involving mid left circumflex and first diagonal. 2.  Mildly elevated left ventricular end-diastolic pressure at 22 mmHg.  Left ventricular angiography was not performed. 3.  Successful angioplasty and drug-eluting stent placement to the mid left circumflex.   Recent  Labs: 08/23/2019: TSH 1.718 08/24/2019: ALT 59 09/03/2019: BUN 6; Creatinine, Ser 1.11; Hemoglobin 14.9; Platelets 86; Potassium 4.5; Sodium 142   Recent Lipid Panel    Component Value Date/Time   CHOL 128 08/23/2019 2215   TRIG 113 08/23/2019 2215   HDL 22 (L) 08/23/2019 2215   CHOLHDL 5.8 08/23/2019 2215   VLDL 23 08/23/2019 2215   LDLCALC 83 08/23/2019 2215    Physical Exam:   VS:  There were no vitals taken for this visit.   Wt Readings from Last 3 Encounters:  10/02/19 289 lb 12.8 oz (131.5 kg)  09/03/19 289 lb 12.8 oz (131.5 kg)  08/25/19 296 lb 6.4 oz (134.4 kg)    General: Well nourished, well developed, in no acute distress Heart: Atraumatic, normal size  Eyes: PEERLA, EOMI  Neck:  Supple, no JVD Endocrine: No thryomegaly Cardiac: Normal S1, S2; RRR; no murmurs, rubs, or gallops Lungs: Clear to auscultation bilaterally, no wheezing, rhonchi or rales  Abd: Soft, nontender, no hepatomegaly  Ext: No edema, pulses 2+ Musculoskeletal: No deformities, BUE and BLE strength normal and equal Skin: Warm and dry, no rashes   Neuro: Alert and oriented to person, place, time, and situation, CNII-XII grossly intact, no focal deficits  Psych: Normal mood and affect   ASSESSMENT:   Kenneth Mckee is a 54 y.o. male who presents for the following: No diagnosis found.  PLAN:   There are no diagnoses linked to this encounter.  Disposition: No follow-ups on file.  Medication Adjustments/Labs and Tests Ordered: Current medicines are reviewed at length with the patient today.  Concerns regarding medicines are outlined above.  No orders of the defined types were placed in this encounter.  No orders of the defined types were placed in this encounter.   There are no Patient Instructions on file for this visit.   Time Spent with Patient: I have spent a total of *** minutes with patient reviewing hospital notes, telemetry, EKGs, labs and examining the patient as well as establishing an  assessment and plan that was discussed with the patient.  > 50% of time was spent in direct patient care.  Signed, Lenna Gilford. Flora Lipps, MD [facility]  [address], [address] [place], [place] [address] [phone]  12/04/2019 3:41 PM

## 2019-12-05 ENCOUNTER — Ambulatory Visit: Payer: No Typology Code available for payment source | Admitting: Cardiovascular Disease

## 2020-01-18 ENCOUNTER — Ambulatory Visit: Payer: No Typology Code available for payment source | Admitting: Cardiovascular Disease

## 2020-01-23 ENCOUNTER — Telehealth: Payer: Self-pay

## 2020-01-23 NOTE — Telephone Encounter (Signed)
   [facility] Pre-operative Risk Assessment    Request for surgical clearance:  1. What type of surgery is being performed? Cleaning, Filling, Crowns, Bridges   2. When is this surgery scheduled? Not specified   3. What type of clearance is required (medical clearance vs. Pharmacy clearance to hold med vs. Both)? Both  4. Are there any medications that need to be held prior to surgery and how long? Plavix/ASA ( also questioning if pt need prophylaxis antibiotic)    5. Practice name and name of physician performing surgery? [facility] ( Dr. Laqueta Linden)  6. What is your office phone and fax number? Fax- [phone]   7. Anesthesia type (None, local, MAC, general) ? Local (with epinephrine).    Meryl Crutch 01/23/2020, 10:26 AM  _________________________________________________________________   (provider comments below)

## 2020-01-24 NOTE — Telephone Encounter (Signed)
   Primary Cardiologist: Reatha Harps, MD  Chart reviewed as part of pre-operative protocol coverage.   Simple dental extractions, procedures, and cleanings are considered low risk procedures per guidelines and generally do not require any specific cardiac clearance. It is also generally accepted that for simple extractions and dental cleanings, there is no need to interrupt blood thinner therapy.   SBE prophylaxis is not required for the patient.  I will route this recommendation to the requesting party via Epic fax function and remove from pre-op pool.  Please call with questions.  Beatriz Stallion, PA-C 01/24/2020, 11:05 AM

## 2020-02-14 NOTE — Progress Notes (Signed)
Cardiology Office Note:   Date:  02/15/2020  NAME:  Kenneth Mckee    MRN: [id number] DOB:  03-Dec-1965   PCP:  Oneita Hurt, No  Cardiologist:  Reatha Harps, MD  Electrophysiologist:  None   Referring MD: No ref. provider found   Chief Complaint  Patient presents with  . Coronary Artery Disease   History of Present Illness:   Kenneth Mckee is a 54 y.o. male with a hx of CAD, HLD, fatty liver, thrombocytopenia, tobacco abuse who presents for follow-up.  He reports he is doing well.  Did not complete his lipid profile is fasting.  We still need to get his LDL cholesterol under control.  He can come back in the next few weeks to recheck this.  He is still smoking 1 pack/day.  Not exercising routinely.  Reports he is limited by arthritis in the knees and back.  He denies any chest pain or exertional chest pressure.  He reports no shortness of breath.  No bleeding issues reported on aspirin and Plavix.  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  He will let us know if he needs to do that.  For now we will continue aspirin and Plavix.  Blood pressure slightly elevated today.  Overall within normal limits on recent visits.  He denies any chest pain, shortness of breath or palpitations in office today.  Problem List 1. NSTEMI 08/23/2019 -PCI to mid LCX -99% D1 lesion but small vessel and declined staged PCI 2. GERD 3. Fatty liver 4. Thrombocytopenia 5. Obesity/OSA 6. Tobacco abuse   Past Medical History: Past Medical History:  Diagnosis Date  . Arm vein blood clot    right arm  . Arthritis   . Chickenpox   . GERD (gastroesophageal reflux disease)   . NSTEMI (non-ST elevated myocardial infarction) (HCC) 08/23/2019  . Phlebitis    right wrist artery- so small. Artery so far down  no surgery indicated. Was on coumadin for 6 months in 2004  . Sleep apnea 2004-2006   sleep study (severe)' wears BiPap nightly  . Umbilical hernia     Past Surgical History: Past Surgical History:   Procedure Laterality Date  . ANTERIOR CERVICAL DECOMP/DISCECTOMY FUSION N/A 02/13/2013   Procedure: ANTERIOR CERVICAL DECOMPRESSION/DISCECTOMY FUSION Cervical Three-Four, Four-Five, Five-Six ;  Surgeon: Temple Pacini, MD;  Location: [facility];  Service: Neurosurgery;  Laterality: N/A;  ANTERIOR CERVICAL DECOMPRESSION/DISCECTOMY FUSION 3 LEVELS  . CERVICAL DISCECTOMY  2005  . CHOLECYSTECTOMY N/A 09/03/2012   Procedure: LAPAROSCOPIC CHOLECYSTECTOMY WITH INTRAOPERATIVE CHOLANGIOGRAM;  Surgeon: Liz Malady, MD;  Location: [facility];  Service: General;  Laterality: N/A;  . HERNIA REPAIR  2013   epigastric hernia repair w/mesh  . LEFT HEART CATH AND CORONARY ANGIOGRAPHY N/A 08/24/2019   Procedure: LEFT HEART CATH AND CORONARY ANGIOGRAPHY;  Surgeon: Iran Ouch, MD;  [facility];  Service: Cardiovascular;  Laterality: N/A;  . NASAL SINUS SURGERY    . TONSILLECTOMY    . UPPER GI ENDOSCOPY      Current Medications: Current Meds  Medication Sig  . aspirin 81 MG chewable tablet Chew 1 tablet (81 mg total) by mouth daily.  Marland Kitchen atorvastatin (LIPITOR) 40 MG tablet Take 1 tablet (40 mg total) by mouth daily at 6 PM.  . Benzoyl Peroxide 10 % LIQD Apply 1 application topically daily. Use to wash affected area  . busPIRone (BUSPAR) 15 MG tablet Take 7.5 mg by mouth as needed.   Marland Kitchen  Cholecalciferol 50 MCG (2000 UT) TABS Take 1 tablet by mouth daily.  . clindamycin (CLEOCIN T) 1 % external solution Apply 1 application topically daily.  . clopidogrel (PLAVIX) 75 MG tablet Take 1 tablet (75 mg total) by mouth daily with breakfast.  . cyclobenzaprine (FLEXERIL) 10 MG tablet Take 10 mg by mouth as needed for muscle spasms.  Marland Kitchen. doxycycline (MONODOX) 100 MG capsule Take 100 mg by mouth as needed (for flare ups).   . Meclizine HCl 25 MG CHEW Chew 25 mg by mouth as needed for dizziness.  . methocarbamol (ROBAXIN) 500 MG tablet Take 500 mg by mouth as needed for muscle spasms.  . metoprolol  tartrate (LOPRESSOR) 25 MG tablet Take 0.5 tablets (12.5 mg total) by mouth 2 (two) times daily.  . Multiple Vitamins-Iron TABS Take 1 tablet by mouth daily.  . nitroGLYCERIN (NITROSTAT) 0.4 MG SL tablet Place 1 tablet (0.4 mg total) under the tongue every 5 (five) minutes x 3 doses as needed for chest pain.  . pantoprazole (PROTONIX) 40 MG tablet Take 40 mg by mouth.  . promethazine (PHENERGAN) 25 MG tablet Take 25 mg by mouth as needed for nausea.  . rizatriptan (MAXALT) 5 MG tablet Take 2.5 mg by mouth as needed for migraine.  . varenicline (CHANTIX STARTING MONTH PAK) 0.5 MG X 11 & 1 MG X 42 tablet Take one 0.5 mg tablet by mouth once daily for 3 days, then increase to one 0.5 mg tablet twice daily for 4 days, then increase to one 1 mg tablet twice daily.  . [DISCONTINUED] escitalopram (LEXAPRO) 10 MG tablet Take 10 mg by mouth 3 (three) times daily.   . [DISCONTINUED] gabapentin (NEURONTIN) 300 MG capsule Take 300 mg by mouth as needed for pain.     Allergies:    Penicillins and Sumatriptan   Social History: Social History   Socioeconomic History  . Marital status: Married    Spouse name: Not on file  . Number of children: 1  . Years of education: Not on file  . Highest education level: Not on file  Occupational History  . Occupation: Statisticianepairman    Employer: [employer]  Tobacco Use  . Smoking status: Current Every Day Smoker    Packs/day: 1.00    Years: 25.00    Pack years: 25.00    Types: Cigarettes  . Smokeless tobacco: Never Used  Substance and Sexual Activity  . 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 Drug use: No    Comment: remote marijuana use   . Sexual activity: Not on file  Other Topics Concern  . Not on file  Social History Narrative   Regular exercise - no    Social Determinants of Health   Financial Resource Strain:   . Difficulty of Paying Living Expenses: Not on file  Food Insecurity:   . Worried About Community education officerunning  Out of Food in the Last Year: Not on file  . Ran Out of Food in the Last Year: Not on file  Transportation Needs:   . Lack of Transportation (Medical): Not on file  . Lack of Transportation (Non-Medical): Not on file  Physical Activity:   . Days of Exercise per Week: Not on file  . Minutes of Exercise per Session: Not on file  Stress:   . Feeling of Stress : Not on file  Social Connections:   . Frequency of Communication with Friends and Family: Not on file  .  Frequency of Social Gatherings with Friends and Family: Not on file  . Attends Religious Services: Not on file  . Active Member of Clubs or Organizations: Not on file  . Attends Banker Meetings: Not on file  . Marital Status: Not on file     Family History: The patient's family history includes Heart attack (age of onset: 28) in his father; Leukemia in his paternal grandfather.  ROS:   All other ROS reviewed and negative. Pertinent positives noted in the HPI.     EKGs/Labs/Other Studies Reviewed:   The following studies were personally reviewed by me today:   Recent Labs: 08/23/2019: TSH 1.718 08/24/2019: ALT 59 09/03/2019: BUN 6; Creatinine, Ser 1.11; Hemoglobin 14.9; Platelets 86; Potassium 4.5; Sodium 142   Recent Lipid Panel    Component Value Date/Time   CHOL 128 08/23/2019 2215   TRIG 113 08/23/2019 2215   HDL 22 (L) 08/23/2019 2215   CHOLHDL 5.8 08/23/2019 2215   VLDL 23 08/23/2019 2215   LDLCALC 83 08/23/2019 2215    Physical Exam:   VS:  BP (!) 142/78   Pulse 77   Ht 6\' 2"  (1.88 m)   Wt 293 lb 12.8 oz (133.3 kg)   SpO2 95%   BMI 37.72 kg/m    Wt Readings from Last 3 Encounters:  02/15/20 293 lb 12.8 oz (133.3 kg)  10/02/19 289 lb 12.8 oz (131.5 kg)  09/03/19 289 lb 12.8 oz (131.5 kg)    General: Well nourished, well developed, in no acute distress Heart: Atraumatic, normal size  Eyes: PEERLA, EOMI  Neck: Supple, no JVD Endocrine: No thryomegaly Cardiac: Normal S1, S2; RRR; no  murmurs, rubs, or gallops Lungs: Clear to auscultation bilaterally, no wheezing, rhonchi or rales  Abd: Soft, nontender, no hepatomegaly  Ext: No edema, pulses 2+ Musculoskeletal: No deformities, BUE and BLE strength normal and equal Skin: Warm and dry, no rashes   Neuro: Alert and oriented to person, place, time, and situation, CNII-XII grossly intact, no focal deficits  Psych: Normal mood and affect   ASSESSMENT:   KAHLEN MORAIS is a 54 y.o. male who presents for the following: 1. Coronary artery disease involving native coronary artery of native heart without angina pectoris   2. Mixed hyperlipidemia   3. Thrombocytopenia (HCC)   4. Tobacco abuse     PLAN:   1. Coronary artery disease involving native coronary artery of native heart without angina pectoris -Non-STEMI 08/23/2019.  Status post PCI to mid circumflex.  Had 99% first diagonal lesion that has been managed medically.  No symptoms of chest pain or angina.  Plan to continue aspirin Plavix for 1 year.  Stop date will be April 2021.  I will see him back in 6 months for this.  He will continue metoprolol tartrate 25 mg twice daily.  He will also continue Lipitor 40 mg daily.  We are awaiting a repeat lipid profile.  He will let May 2021 know if he needs any procedures.  I would be okay to stop his Plavix a bit early but since we do not have a date in hand we will not stop this.  2. Mixed hyperlipidemia -Continue Lipitor.  Repeat lipid pending.  3. Thrombocytopenia (HCC) -No issues.  4. Tobacco abuse Still smoking 1 pack/day.  Advised to quit smoking.  Disposition: Return in about 6 months (around 08/15/2020).  Medication Adjustments/Labs and Tests Ordered: Current medicines are reviewed at length with the patient today.  Concerns regarding medicines are  outlined above.  Orders Placed This Encounter  Procedures  . Lipid panel   No orders of the defined types were placed in this encounter.   Patient Instructions  Medication  Instructions:  The current medical regimen is effective;  continue present plan and medications.  *If you need a refill on your cardiac medications before your next appointment, please call your pharmacy*   Lab Work: LIPID (in 1 week, come back fasting, nothing to eat or drink- no lab appointment needed)  If you have labs (blood work) drawn today and your tests are completely normal, you will receive your results only by: Marland Kitchen MyChart Message (if you have MyChart) OR . A paper copy in the mail If you have any lab test that is abnormal or we need to change your treatment, we will call you to review the results.   Follow-Up: At [facility], you and your health needs are our priority.  As part of our continuing mission to provide you with exceptional heart care, we have created designated Provider Care Teams.  These Care Teams include your primary Cardiologist (physician) and Advanced Practice Providers (APPs -  Physician Assistants and Nurse Practitioners) who all work together to provide you with the care you need, when you need it.  We recommend signing up for the patient portal called "MyChart".  Sign up information is provided on this After Visit Summary.  MyChart is used to connect with patients for Virtual Visits (Telemedicine).  Patients are able to view lab/test results, encounter notes, upcoming appointments, etc.  Non-urgent messages can be sent to your provider as well.   To learn more about what you can do with MyChart, go to [email or web address].    Your next appointment:   6 month(s)  The format for your next appointment:   In Person  Provider:   Lennie Odor, MD         Time Spent with Patient: I have spent a total of 25 minutes with patient reviewing hospital notes, telemetry, EKGs, labs and examining the patient as well as establishing an assessment and plan that was discussed with the patient.  > 50% of time was spent in direct patient care.  Signed, Lenna Gilford. Flora Lipps, MD [facility]  [address] [place], [place] [address] [phone]  02/15/2020 1:46 PM

## 2020-02-15 ENCOUNTER — Other Ambulatory Visit: Payer: Self-pay

## 2020-02-15 ENCOUNTER — Encounter: Payer: Self-pay | Admitting: Cardiovascular Disease

## 2020-02-15 ENCOUNTER — Ambulatory Visit (INDEPENDENT_AMBULATORY_CARE_PROVIDER_SITE_OTHER): Payer: No Typology Code available for payment source | Admitting: Cardiovascular Disease

## 2020-02-15 VITALS — BP 142/78 | HR 77 | Ht 74.0 in | Wt 293.8 lb

## 2020-02-15 DIAGNOSIS — Z720 Tobacco use: Secondary | ICD-10-CM | POA: Diagnosis not present

## 2020-02-15 DIAGNOSIS — E782 Mixed hyperlipidemia: Secondary | ICD-10-CM | POA: Diagnosis not present

## 2020-02-15 DIAGNOSIS — D696 Thrombocytopenia, unspecified: Secondary | ICD-10-CM

## 2020-02-15 DIAGNOSIS — I2510 Atherosclerotic heart disease of native coronary artery without angina pectoris: Secondary | ICD-10-CM

## 2020-02-15 NOTE — Patient Instructions (Signed)
Medication Instructions:  The current medical regimen is effective;  continue present plan and medications.  *If you need a refill on your cardiac medications before your next appointment, please call your pharmacy*   Lab Work: LIPID (in 1 week, come back fasting, nothing to eat or drink- no lab appointment needed)  If you have labs (blood work) drawn today and your tests are completely normal, you will receive your results only by: Marland Kitchen MyChart Message (if you have MyChart) OR . A paper copy in the mail If you have any lab test that is abnormal or we need to change your treatment, we will call you to review the results.   Follow-Up: At [facility], you and your health needs are our priority.  As part of our continuing mission to provide you with exceptional heart care, we have created designated Provider Care Teams.  These Care Teams include your primary Cardiologist (physician) and Advanced Practice Providers (APPs -  Physician Assistants and Nurse Practitioners) who all work together to provide you with the care you need, when you need it.  We recommend signing up for the patient portal called "MyChart".  Sign up information is provided on this After Visit Summary.  MyChart is used to connect with patients for Virtual Visits (Telemedicine).  Patients are able to view lab/test results, encounter notes, upcoming appointments, etc.  Non-urgent messages can be sent to your provider as well.   To learn more about what you can do with MyChart, go to [email or web address].    Your next appointment:   6 month(s)  The format for your next appointment:   In Person  Provider:   Lennie Odor, MD

## 2020-07-11 IMAGING — US US ABDOMEN LIMITED
1 series · 14 of 25 positions shown · non-contrast
Comparison: None.

CLINICAL DATA: Transaminitis

EXAM:
ULTRASOUND ABDOMEN LIMITED RIGHT UPPER QUADRANT

[Series 1: us abdomen limited · 14 of 28 slices shown]
[im 1/28]
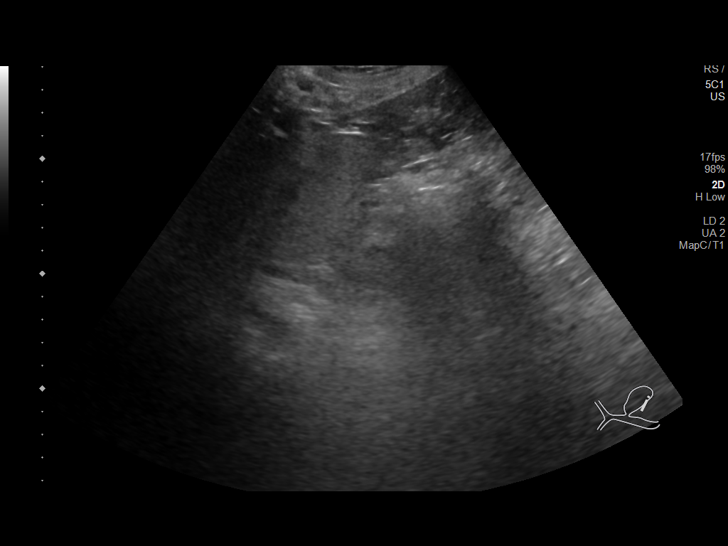
[im 3/28]
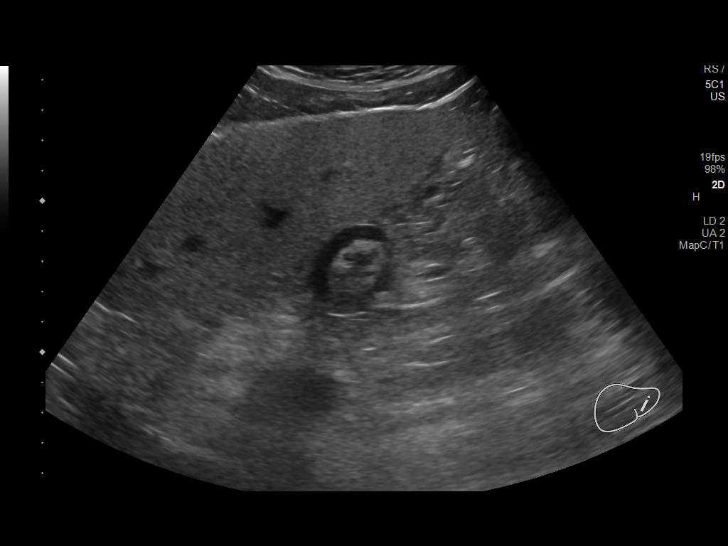
[im 5/28]
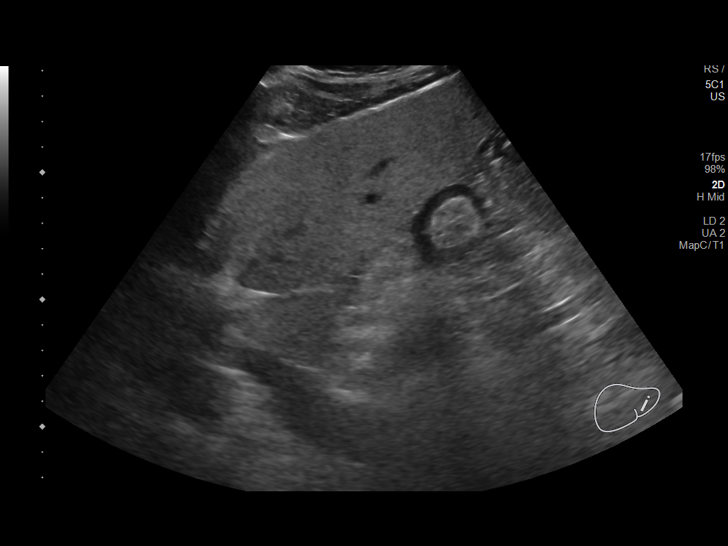
[im 7/28]
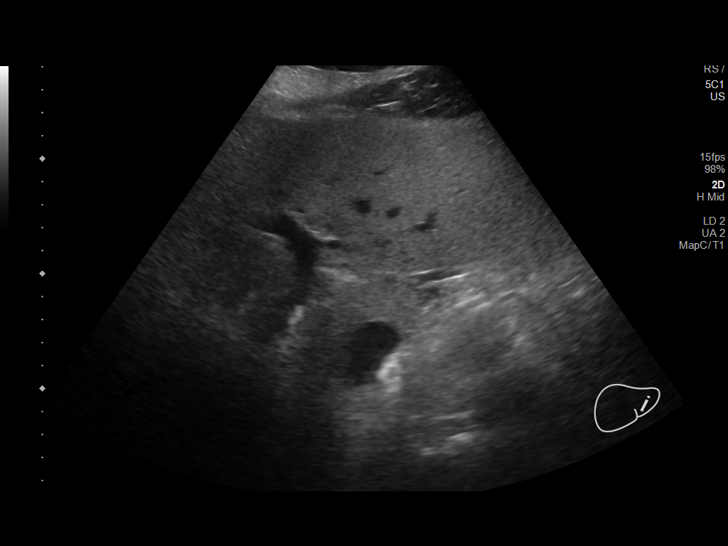
[im 10/28]
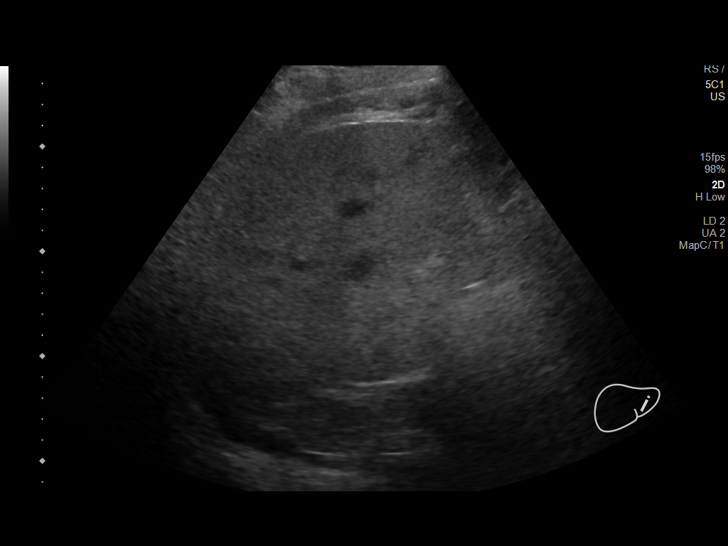
[im 11/28]
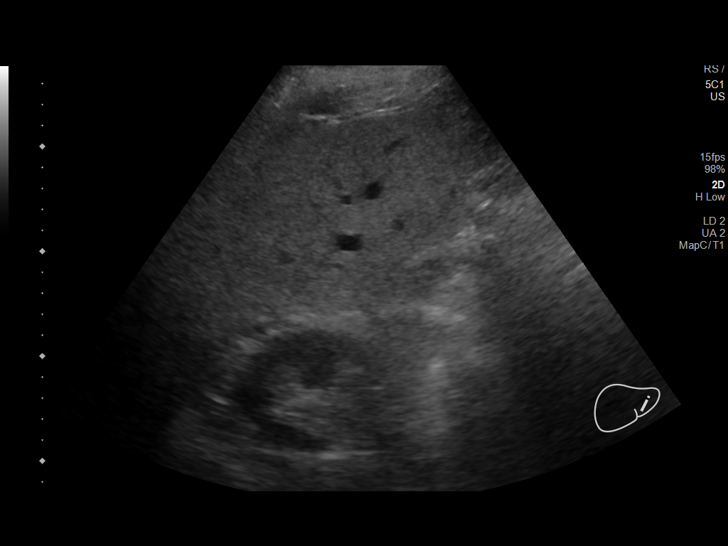
[im 13/28]
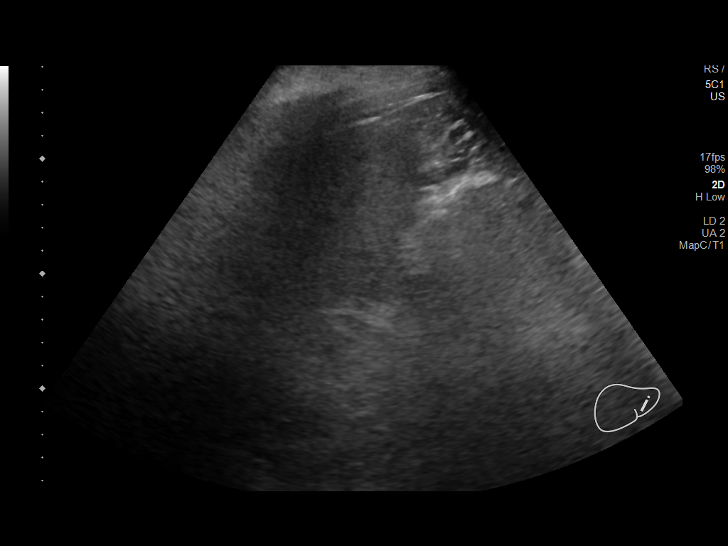
[im 15/28]
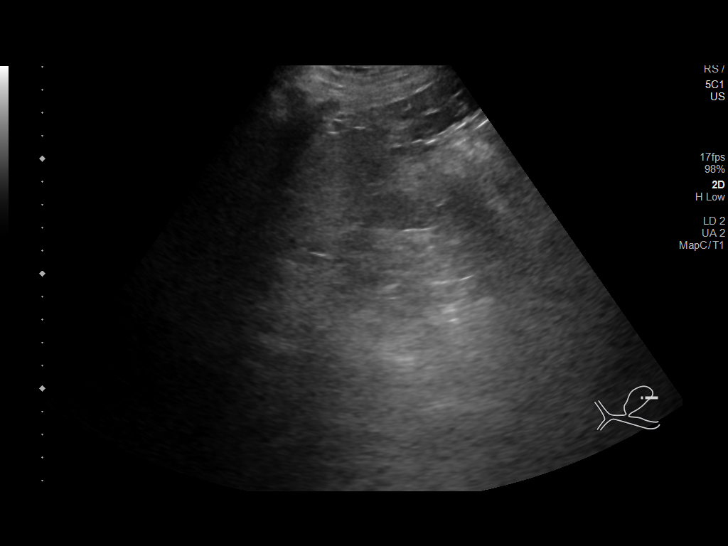
[im 17/28]
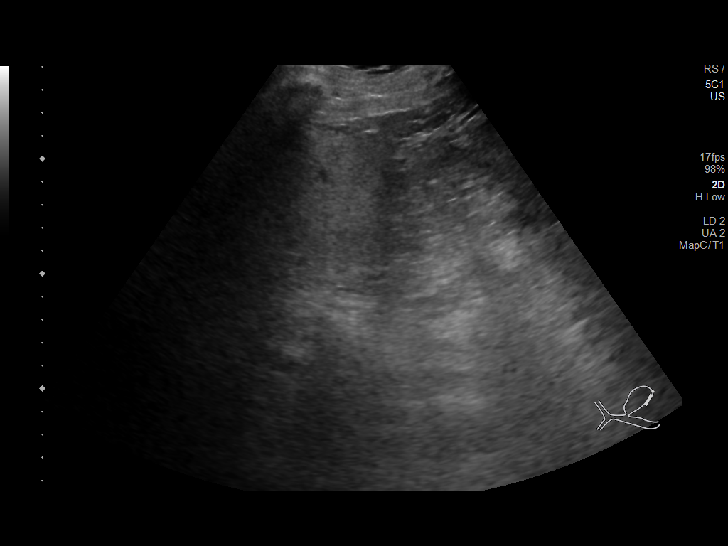
[im 19/28]
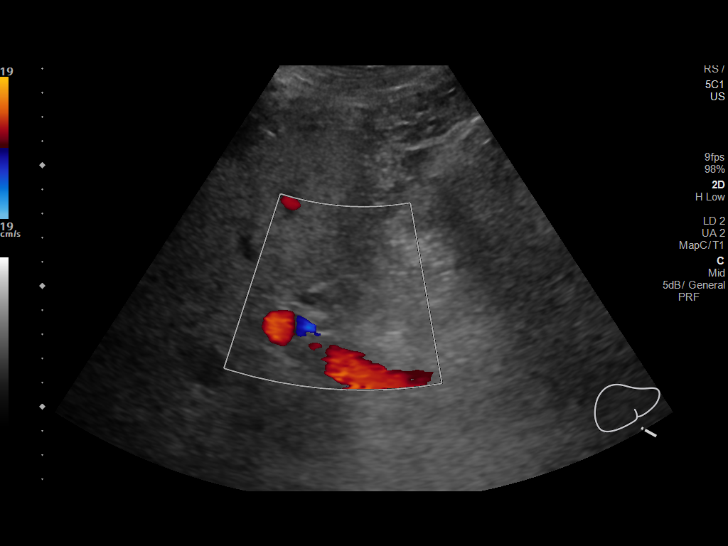
[im 21/28]
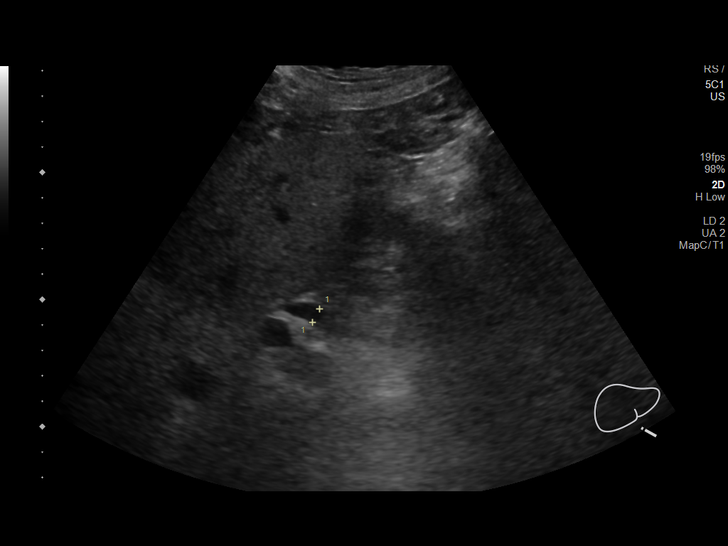
[im 23/28]
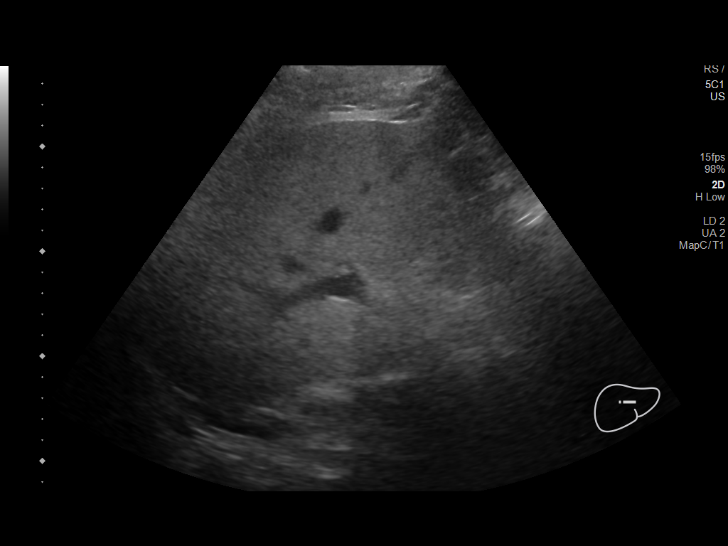
[im 25/28]
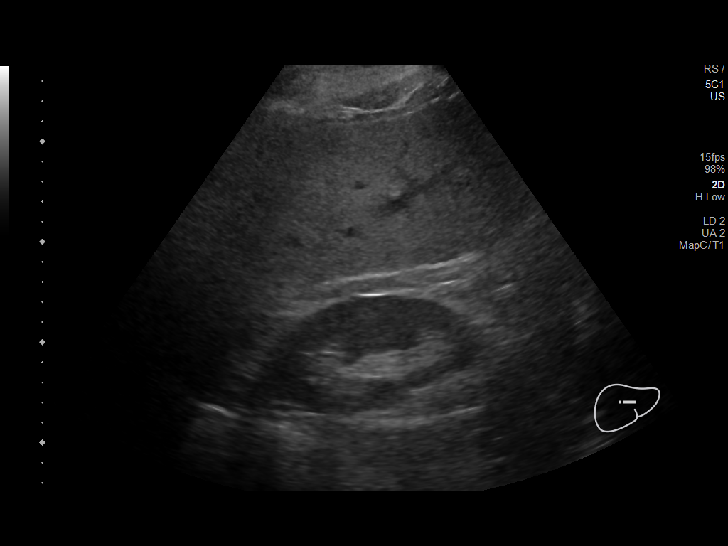
[im 28/28]
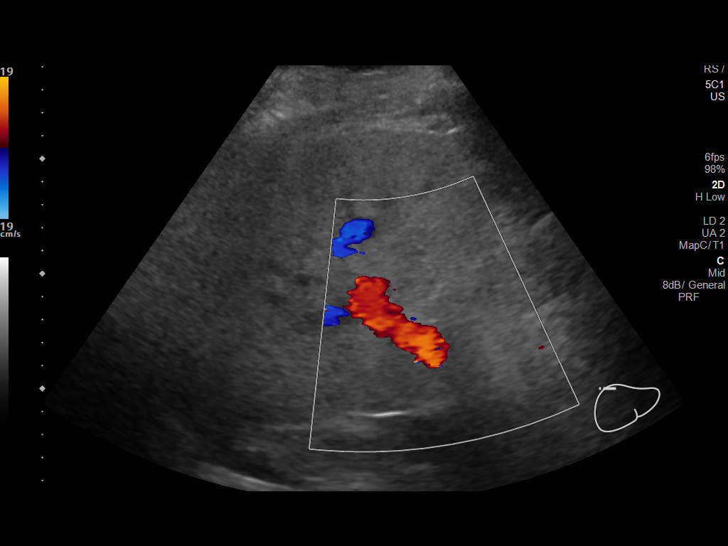

[14 of 25 positions shown; findings below may reference images not displayed]

FINDINGS: Gallbladder:

Status post cholecystectomy.

Common bile duct:

Diameter: 6 mm

Liver:

No focal lesion identified. Increased parenchymal echogenicity.
Portal vein is patent on color Doppler imaging with normal direction
of blood flow towards the liver.

Other: None.
IMPRESSION: 1.  Hepatic steatosis.

2.  Status post cholecystectomy.

## 2020-07-24 ENCOUNTER — Telehealth: Payer: Self-pay | Admitting: *Deleted

## 2020-07-24 NOTE — Telephone Encounter (Signed)
A message was left, re: his follow up visit. 

## 2020-10-08 ENCOUNTER — Other Ambulatory Visit (HOSPITAL_COMMUNITY): Payer: Self-pay | Admitting: Neurology

## 2020-10-08 DIAGNOSIS — M545 Low back pain, unspecified: Secondary | ICD-10-CM

## 2020-10-27 ENCOUNTER — Telehealth (HOSPITAL_COMMUNITY): Payer: Self-pay

## 2020-10-27 ENCOUNTER — Encounter (HOSPITAL_COMMUNITY): Payer: Self-pay | Admitting: *Deleted

## 2020-10-27 ENCOUNTER — Encounter (HOSPITAL_COMMUNITY): Payer: Self-pay | Admitting: Certified Registered Nurse Anesthetist

## 2020-10-27 NOTE — Progress Notes (Signed)
Kenneth Mckee called to cancel the MRI, scheduled for tomorrow. I asked patient to call the [facility] department that scheduled the appointment and ask them to call Radiology and cancel it, Kenneth Mckee said he will call,  but he does not think anyone will answer.I sent an email to MRI scheduler CLuiz Blare with this information.

## 2020-10-28 ENCOUNTER — Ambulatory Visit: Admit: 2020-10-28 | Payer: No Typology Code available for payment source

## 2020-10-28 ENCOUNTER — Ambulatory Visit (HOSPITAL_COMMUNITY): Admission: RE | Admit: 2020-10-28 | Payer: No Typology Code available for payment source | Source: Ambulatory Visit

## 2020-10-28 ENCOUNTER — Encounter (HOSPITAL_COMMUNITY): Payer: Self-pay

## 2020-10-28 HISTORY — DX: Post-traumatic stress disorder, unspecified: F43.10

## 2020-10-28 HISTORY — DX: Fatty (change of) liver, not elsewhere classified: K76.0

## 2020-10-28 SURGERY — MRI WITH ANESTHESIA
Anesthesia: General

## 2020-12-09 ENCOUNTER — Ambulatory Visit (HOSPITAL_COMMUNITY): Admission: RE | Admit: 2020-12-09 | Payer: No Typology Code available for payment source | Source: Home / Self Care

## 2020-12-09 ENCOUNTER — Ambulatory Visit (HOSPITAL_COMMUNITY): Admission: RE | Admit: 2020-12-09 | Payer: No Typology Code available for payment source | Source: Ambulatory Visit

## 2020-12-09 ENCOUNTER — Encounter (HOSPITAL_COMMUNITY): Admission: RE | Payer: Self-pay | Source: Home / Self Care

## 2020-12-09 SURGERY — MRI WITH ANESTHESIA
Anesthesia: General

## 2021-01-14 ENCOUNTER — Telehealth: Payer: Self-pay | Admitting: Cardiovascular Disease

## 2021-01-14 NOTE — Telephone Encounter (Signed)
Spoke with patient, denies shortness of breath  This has been going on for months, sits all day  Swelling does go down some at night  Hit leg with hammer almost 2 months ago, that leg is twice as big as other leg and is red  [facility] and [facility] and oral antibiotics which made him very sick so he did not complete Patient has call into [place] Advised if he did not hear back from [place] he needed to go to Urgent Care for evaluation and to call back with update Keep appointment as scheduled

## 2021-01-14 NOTE — Telephone Encounter (Signed)
Pt c/o swelling: STAT is pt has developed SOB within 24 hours  How much weight have you gained and in what time span? no  If swelling, where is the swelling located? Both legs (patient had injury to his leg about 6-8 weeks ago, and it hasn't heal. Patient states that his right leg is double the size of his left leg. He can only wear sandals right now unable to get shoes on.  Are you currently taking a fluid pill? no  Are you currently SOB? no  Do you have a log of your daily weights (if so, list)? no  Have you gained 3 pounds in a day or 5 pounds in a week? no  Have you traveled recently? no   Schedule patient for 10/7 at 8 at [facility] but patient is looking for a sooner appt to be seen

## 2021-01-15 NOTE — Telephone Encounter (Signed)
Called patient, advised that Dr.O'Neal recommendations. Left call back number if questions or concerns.

## 2021-02-08 ENCOUNTER — Emergency Department (HOSPITAL_BASED_OUTPATIENT_CLINIC_OR_DEPARTMENT_OTHER): Payer: No Typology Code available for payment source

## 2021-02-08 ENCOUNTER — Inpatient Hospital Stay (HOSPITAL_BASED_OUTPATIENT_CLINIC_OR_DEPARTMENT_OTHER)
Admission: EM | Admit: 2021-02-08 | Discharge: 2021-02-10 | DRG: 195 | Disposition: A | Discharge status: Home / Self Care | Payer: No Typology Code available for payment source | Attending: Internal Medicine | Admitting: Internal Medicine

## 2021-02-08 ENCOUNTER — Telehealth: Payer: Self-pay | Admitting: Physician Assistant

## 2021-02-08 ENCOUNTER — Encounter (HOSPITAL_BASED_OUTPATIENT_CLINIC_OR_DEPARTMENT_OTHER): Payer: Self-pay | Admitting: Emergency Medicine

## 2021-02-08 DIAGNOSIS — R7309 Other abnormal glucose: Secondary | ICD-10-CM | POA: Diagnosis present

## 2021-02-08 DIAGNOSIS — J189 Pneumonia, unspecified organism: Secondary | ICD-10-CM | POA: Diagnosis not present

## 2021-02-08 DIAGNOSIS — Z7902 Long term (current) use of antithrombotics/antiplatelets: Secondary | ICD-10-CM

## 2021-02-08 DIAGNOSIS — I252 Old myocardial infarction: Secondary | ICD-10-CM

## 2021-02-08 DIAGNOSIS — I10 Essential (primary) hypertension: Secondary | ICD-10-CM | POA: Diagnosis present

## 2021-02-08 DIAGNOSIS — E876 Hypokalemia: Secondary | ICD-10-CM | POA: Diagnosis present

## 2021-02-08 DIAGNOSIS — Z20822 Contact with and (suspected) exposure to covid-19: Secondary | ICD-10-CM | POA: Diagnosis present

## 2021-02-08 DIAGNOSIS — I4510 Unspecified right bundle-branch block: Secondary | ICD-10-CM | POA: Diagnosis present

## 2021-02-08 DIAGNOSIS — Z7982 Long term (current) use of aspirin: Secondary | ICD-10-CM

## 2021-02-08 DIAGNOSIS — F4310 Post-traumatic stress disorder, unspecified: Secondary | ICD-10-CM | POA: Diagnosis present

## 2021-02-08 DIAGNOSIS — D6959 Other secondary thrombocytopenia: Secondary | ICD-10-CM | POA: Diagnosis present

## 2021-02-08 DIAGNOSIS — Z806 Family history of leukemia: Secondary | ICD-10-CM

## 2021-02-08 DIAGNOSIS — F17210 Nicotine dependence, cigarettes, uncomplicated: Secondary | ICD-10-CM | POA: Diagnosis present

## 2021-02-08 DIAGNOSIS — E785 Hyperlipidemia, unspecified: Secondary | ICD-10-CM | POA: Diagnosis present

## 2021-02-08 DIAGNOSIS — G4733 Obstructive sleep apnea (adult) (pediatric): Secondary | ICD-10-CM | POA: Diagnosis present

## 2021-02-08 DIAGNOSIS — E669 Obesity, unspecified: Secondary | ICD-10-CM | POA: Diagnosis present

## 2021-02-08 DIAGNOSIS — I2510 Atherosclerotic heart disease of native coronary artery without angina pectoris: Secondary | ICD-10-CM | POA: Diagnosis present

## 2021-02-08 DIAGNOSIS — K760 Fatty (change of) liver, not elsewhere classified: Secondary | ICD-10-CM | POA: Diagnosis present

## 2021-02-08 DIAGNOSIS — R7303 Prediabetes: Secondary | ICD-10-CM | POA: Diagnosis present

## 2021-02-08 DIAGNOSIS — K219 Gastro-esophageal reflux disease without esophagitis: Secondary | ICD-10-CM | POA: Diagnosis present

## 2021-02-08 DIAGNOSIS — Z8249 Family history of ischemic heart disease and other diseases of the circulatory system: Secondary | ICD-10-CM

## 2021-02-08 DIAGNOSIS — Z955 Presence of coronary angioplasty implant and graft: Secondary | ICD-10-CM

## 2021-02-08 DIAGNOSIS — Z6837 Body mass index (BMI) 37.0-37.9, adult: Secondary | ICD-10-CM

## 2021-02-08 DIAGNOSIS — Z720 Tobacco use: Secondary | ICD-10-CM | POA: Diagnosis present

## 2021-02-08 DIAGNOSIS — Z79899 Other long term (current) drug therapy: Secondary | ICD-10-CM

## 2021-02-08 HISTORY — DX: Atherosclerotic heart disease of native coronary artery without angina pectoris: I25.10

## 2021-02-08 LAB — RESP PANEL BY RT-PCR (FLU A&B, COVID) ARPGX2
Influenza A by PCR: NEGATIVE
Influenza B by PCR: NEGATIVE
SARS Coronavirus 2 by RT PCR: NEGATIVE

## 2021-02-08 LAB — CBC WITH DIFFERENTIAL/PLATELET
Abs Immature Granulocytes: 0.01 10*3/uL (ref 0.00–0.07)
Basophils Absolute: 0 10*3/uL (ref 0.0–0.1)
Basophils Relative: 1 %
Eosinophils Absolute: 0.1 10*3/uL (ref 0.0–0.5)
Eosinophils Relative: 2 %
HCT: 36.2 % — ABNORMAL LOW (ref 39.0–52.0)
Hemoglobin: 12.1 g/dL — ABNORMAL LOW (ref 13.0–17.0)
Immature Granulocytes: 0 %
Lymphocytes Relative: 16 %
Lymphs Abs: 0.9 10*3/uL (ref 0.7–4.0)
MCH: 31.7 pg (ref 26.0–34.0)
MCHC: 33.4 g/dL (ref 30.0–36.0)
MCV: 94.8 fL (ref 80.0–100.0)
Monocytes Absolute: 0.4 10*3/uL (ref 0.1–1.0)
Monocytes Relative: 7 %
Neutro Abs: 4.4 10*3/uL (ref 1.7–7.7)
Neutrophils Relative %: 74 %
Platelets: 89 10*3/uL — ABNORMAL LOW (ref 150–400)
RBC: 3.82 MIL/uL — ABNORMAL LOW (ref 4.22–5.81)
RDW: 15.5 % (ref 11.5–15.5)
WBC: 5.9 10*3/uL (ref 4.0–10.5)
nRBC: 0 % (ref 0.0–0.2)

## 2021-02-08 LAB — COMPREHENSIVE METABOLIC PANEL
ALT: 30 U/L (ref 0–44)
AST: 67 U/L — ABNORMAL HIGH (ref 15–41)
Albumin: 4 g/dL (ref 3.5–5.0)
Alkaline Phosphatase: 62 U/L (ref 38–126)
Anion gap: 8 (ref 5–15)
BUN: 5 mg/dL — ABNORMAL LOW (ref 6–20)
CO2: 28 mmol/L (ref 22–32)
Calcium: 9 mg/dL (ref 8.9–10.3)
Chloride: 105 mmol/L (ref 98–111)
Creatinine, Ser: 0.92 mg/dL (ref 0.61–1.24)
GFR, Estimated: >60 mL/min (ref >60)
Glucose, Bld: 92 mg/dL (ref 70–99)
Potassium: 3.4 mmol/L — ABNORMAL LOW (ref 3.5–5.1)
Sodium: 141 mmol/L (ref 135–145)
Total Bilirubin: 1 mg/dL (ref 0.3–1.2)
Total Protein: 6.3 g/dL — ABNORMAL LOW (ref 6.5–8.1)

## 2021-02-08 LAB — BRAIN NATRIURETIC PEPTIDE: B Natriuretic Peptide: 96.5 pg/mL (ref 0.0–100.0)

## 2021-02-08 LAB — TROPONIN I (HIGH SENSITIVITY)
Troponin I (High Sensitivity): 4 ng/L (ref <18)
Troponin I (High Sensitivity): 5 ng/L (ref <18)

## 2021-02-08 LAB — D-DIMER, QUANTITATIVE: D-Dimer, Quant: 0.28 ug/mL-FEU (ref 0.00–0.50)

## 2021-02-08 MED ORDER — SODIUM CHLORIDE 0.9 % IV SOLN
1.0000 g | Freq: Once | INTRAVENOUS | Status: AC
  Administered 2021-02-08: 1 g via INTRAVENOUS

## 2021-02-08 MED ORDER — SODIUM CHLORIDE 0.9 % IV SOLN
500.0000 mg | Freq: Once | INTRAVENOUS | Status: AC
  Administered 2021-02-08: 500 mg via INTRAVENOUS
  Filled 2021-02-08: qty 500

## 2021-02-08 MED ORDER — IOHEXOL 350 MG/ML SOLN
100.0000 mL | Freq: Once | INTRAVENOUS | Status: AC | PRN
  Administered 2021-02-08: 100 mL via INTRAVENOUS

## 2021-02-08 NOTE — Telephone Encounter (Addendum)
   The patient called the answering service after-hours today. He has been noticing worsening SOB, even with something like tying his shoes. Feeling like he is going into panic attacks. No chest pain. No palpitations. He has been feeling this for about a week. Friday night was really bad. States he called in earlier this month to be seen - notes indicate based on symptoms was advised to seek care at urgent care at that time. He did not yet seek care elsewhere. He has an appointment with our office later this week. He has also had some leg swelling. Given severity of symptoms, wide differential and potential for life-threatening condition, I have recommended he proceed to the ED for evaluation. I advised that he not drive himself. Offered option of calling EMS. The patient verbalized understanding and gratitude. Will cc to Dr. Flora Lipps as [place] Childes.  Laurann Montana, PA-C

## 2021-02-08 NOTE — Progress Notes (Signed)
Pulse oximetry while ambulating performed on Pt.  Pt SpO2 dropped to 88% while standing from bed, then rose to 92% while he stood still and caught his breath.  SpO2 slowly declined while walking around the emergency department.  Pt SpO2 was 88% when we arrived back to his room.  Upon sitting still his SpO2 returned to 94%.  RN informed.

## 2021-02-08 NOTE — ED Notes (Signed)
Patient transported to CT 

## 2021-02-08 NOTE — ED Triage Notes (Signed)
Sob x 5 days denies cp he states , family had a virus a few  days ago hx of  MI x 1 year ago

## 2021-02-08 NOTE — ED Provider Notes (Addendum)
[facility] EMERGENCY DEPT Provider Note   CSN: [id number] Arrival date & time: 02/08/21  1634     History Chief Complaint  Patient presents with   Shortness of Breath    Kenneth Mckee is a 55 y.o. male.  Patient is a 55 year old male who presents with shortness of breath.  He has a history of a recent NSTEMI status post stent placement in spring of this year, sleep apnea, coronary artery disease, hyperlipidemia, obesity.  He states over the last 2 to 3 days he has had shortness of breath.  He says he feels short of breath with minimal movements.  He has had a little bit of a cough and a little bit of congestion.  No associated chest pain.  He has a little bit of leg swelling but says is that is fairly baseline for him.  No known fevers.  No significant runny nose or congestion.  He has felt generally fatigued.      Past Medical History:  Diagnosis Date   Arm vein blood clot    right arm   Arthritis    Chickenpox    Coronary artery disease    GERD (gastroesophageal reflux disease)    NAFLD (nonalcoholic fatty liver disease)    NSTEMI (non-ST elevated myocardial infarction) (HCC) 08/23/2019   Phlebitis    right wrist artery- so small. Artery so far down  no surgery indicated. Was on coumadin for 6 months in 2004   PTSD (post-traumatic stress disorder)    Sleep apnea 2004-2006   sleep study (severe)' wears BiPap nightly   Umbilical hernia     Patient Active Problem List   Diagnosis Date Noted   NSTEMI (non-ST elevated myocardial infarction) (HCC) 08/23/2019   Thrombocytopenia (HCC) 08/23/2019   Tobacco abuse 08/23/2019   HLD (hyperlipidemia) 08/23/2019   Spinal stenosis in cervical region 02/13/2013   Acute cholecystitis 09/03/2012   Transaminitis 09/03/2012   Hernia, epigastric 09/15/2011   Chickenpox    Arthritis    Phlebitis    OBESITY 06/23/2010   PREDIABETES 05/29/2010   LOW HDL 05/28/2010   ALCOHOL ABUSE, IN REMISSION 05/22/2010   ARTHRITIS  05/22/2010   PLANTAR FASCIITIS 05/22/2010   OSA treated with BiPAP 05/22/2010   EPIGASTRIC PAIN 05/22/2010   CHICKENPOX, HX OF 05/22/2010    Past Surgical History:  Procedure Laterality Date   ANTERIOR CERVICAL DECOMP/DISCECTOMY FUSION N/A 02/13/2013   Procedure: ANTERIOR CERVICAL DECOMPRESSION/DISCECTOMY FUSION Cervical Three-Four, Four-Five, Five-Six ;  Surgeon: Temple Pacini, MD;  Location: [facility];  Service: Neurosurgery;  Laterality: N/A;  ANTERIOR CERVICAL DECOMPRESSION/DISCECTOMY FUSION 3 LEVELS   CERVICAL DISCECTOMY  2005   CHOLECYSTECTOMY N/A 09/03/2012   Procedure: LAPAROSCOPIC CHOLECYSTECTOMY WITH INTRAOPERATIVE CHOLANGIOGRAM;  Surgeon: Liz Malady, MD;  Location: [facility] OR;  Service: General;  Laterality: N/A;   HERNIA REPAIR  2013   epigastric hernia repair w/mesh   LEFT HEART CATH AND CORONARY ANGIOGRAPHY N/A 08/24/2019   Procedure: LEFT HEART CATH AND CORONARY ANGIOGRAPHY;  Surgeon: Iran Ouch, MD;  Location: [facility] INVASIVE CV LAB;  Service: Cardiovascular;  Laterality: N/A;   NASAL SINUS SURGERY     TONSILLECTOMY     UPPER GI ENDOSCOPY         Family History  Problem Relation Age of Onset   Heart attack Father 29   Leukemia Paternal Grandfather     Social History   Tobacco Use   Smoking status: Every Day    Packs/day: 1.00  Years: 25.00    Pack years: 25.00    Types: Cigarettes   Smokeless tobacco: Never  Vaping Use   Vaping Use: Never used  Substance Use Topics   Alcohol use: No    Comment: 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 Authorizing Provider  aspirin 81 MG chewable tablet Chew 1 tablet (81 mg total) by mouth daily. 10/02/19   O'NealRonnald Ramp, MD  atorvastatin (LIPITOR) 40 MG tablet Take 1 tablet (40 mg total) by mouth daily at 6 PM. 10/02/19   O'Neal, Ronnald Ramp, MD   Benzoyl Peroxide 10 % LIQD Apply 1 application topically daily. Use to wash affected area 04/16/19   [provider]  busPIRone (BUSPAR) 15 MG tablet Take 7.5 mg by mouth as needed.  05/25/19   [provider]  Cholecalciferol 50 MCG (2000 UT) TABS Take 1 tablet by mouth daily. 06/27/19   [provider]  clindamycin (CLEOCIN T) 1 % external solution Apply 1 application topically daily. 04/16/19   [provider]  clopidogrel (PLAVIX) 75 MG tablet Take 1 tablet (75 mg total) by mouth daily with breakfast. 10/02/19   O'Neal, Ronnald Ramp, MD  cyclobenzaprine (FLEXERIL) 10 MG tablet Take 10 mg by mouth as needed for muscle spasms. 05/24/19   [provider]  doxycycline (MONODOX) 100 MG capsule Take 100 mg by mouth as needed (for flare ups).  04/15/19   [provider]  Meclizine HCl 25 MG CHEW Chew 25 mg by mouth as needed for dizziness. 11/21/18   [provider]  methocarbamol (ROBAXIN) 500 MG tablet Take 500 mg by mouth as needed for muscle spasms. 05/24/19   [provider]  metoprolol tartrate (LOPRESSOR) 25 MG tablet Take 0.5 tablets (12.5 mg total) by mouth 2 (two) times daily. 10/02/19   O'NealRonnald Ramp, MD  Multiple Vitamins-Iron TABS Take 1 tablet by mouth daily.    [provider]  nitroGLYCERIN (NITROSTAT) 0.4 MG SL tablet Place 1 tablet (0.4 mg total) under the tongue every 5 (five) minutes x 3 doses as needed for chest pain. 08/25/19   Furth, Cadence H, PA-C  pantoprazole (PROTONIX) 40 MG tablet Take 40 mg by mouth.    [provider]  promethazine (PHENERGAN) 25 MG tablet Take 25 mg by mouth as needed for nausea. 06/05/19   [provider]  rizatriptan (MAXALT) 5 MG tablet Take 2.5 mg by mouth as needed for migraine. 06/06/19   [provider]  varenicline (CHANTIX STARTING MONTH PAK) 0.5 MG X 11 & 1 MG X 42 tablet Take one 0.5 mg tablet by mouth once daily for 3 days, then increase to  one 0.5 mg tablet twice daily for 4 days, then increase to one 1 mg tablet twice daily. 09/03/19   Kroeger, Ovidio Kin., PA-C    Allergies    Penicillins and Sumatriptan  Review of Systems   Review of Systems  Constitutional:  Positive for fatigue. Negative for chills, diaphoresis and fever.  HENT:  Negative for congestion, rhinorrhea and sneezing.   Eyes: Negative.   Respiratory:  Positive for shortness of breath. Negative for cough and chest tightness.   Cardiovascular:  Positive for leg swelling. Negative for chest pain.  Gastrointestinal:  Negative for abdominal pain, blood in stool, diarrhea, nausea and vomiting.  Genitourinary:  Negative for difficulty urinating, flank pain, frequency and hematuria.  Musculoskeletal:  Negative for arthralgias and back pain.  Skin:  Negative for rash.  Neurological:  Negative for dizziness, speech difficulty, weakness, numbness and headaches.   Physical Exam Updated Vital Signs BP 138/75 (BP Location: Left Arm)   Pulse 70   Temp 98.1 F (36.7 C) (Oral)   Resp 18   Ht 6\' 1"  (1.854 m)   Wt 129.3 kg   SpO2 97%   BMI 37.60 kg/m   Physical Exam Constitutional:      Appearance: He is well-developed. He is obese.  HENT:     Head: Normocephalic and atraumatic.  Eyes:     Pupils: Pupils are equal, round, and reactive to light.  Cardiovascular:     Rate and Rhythm: Normal rate and regular rhythm.     Heart sounds: Normal heart sounds.  Pulmonary:     Effort: Pulmonary effort is normal. No respiratory distress.     Breath sounds: Normal breath sounds. No wheezing or rales.  Chest:     Chest wall: No tenderness.  Abdominal:     General: Bowel sounds are normal.     Palpations: Abdomen is soft.     Tenderness: There is no abdominal tenderness. There is no guarding or rebound.  Musculoskeletal:        General: Normal range of motion.     Cervical back: Normal range of motion and neck supple.     Comments: 1-2+ pitting edema to lower  extremities bilaterally  Lymphadenopathy:     Cervical: No cervical adenopathy.  Skin:    General: Skin is warm and dry.     Findings: No rash.  Neurological:     Mental Status: He is alert and oriented to person, place, and time.    ED Results / Procedures / Treatments   Labs (all labs ordered are listed, but only abnormal results are displayed) Labs Reviewed  COMPREHENSIVE METABOLIC PANEL - Abnormal; Notable for the following components:      Result Value   Potassium 3.4 (*)    BUN 5 (*)    Total Protein 6.3 (*)    AST 67 (*)    All other components within normal limits  CBC WITH DIFFERENTIAL/PLATELET - Abnormal; Notable for the following components:   RBC 3.82 (*)    Hemoglobin 12.1 (*)    HCT 36.2 (*)    Platelets 89 (*)    All other components within normal limits  RESP PANEL BY RT-PCR (FLU A&B, COVID) ARPGX2  BRAIN NATRIURETIC PEPTIDE  D-DIMER, QUANTITATIVE  TROPONIN I (HIGH SENSITIVITY)  TROPONIN I (HIGH SENSITIVITY)    EKG EKG Interpretation  Date/Time:  Sunday February 08 2021 16:40:46 EDT Ventricular Rate:  59 PR Interval:  120 QRS Duration: 132 QT Interval:  458 QTC Calculation: 454 R Axis:   -32 Text Interpretation: Sinus rhythm Right bundle branch block since last tracing no significant change Confirmed by [id number]) on 02/08/2021 5:17:14 PM  Radiology CT Angio Chest PE W/Cm &/Or Wo Cm  Result Date: 02/08/2021 CLINICAL DATA:  Shortness of breath for 5 days. Hypoxia. High probability for pulmonary embolism. EXAM: CT ANGIOGRAPHY CHEST WITH CONTRAST TECHNIQUE: Multidetector CT imaging of the chest was performed using the standard protocol during bolus administration of intravenous contrast. Multiplanar CT image reconstructions and MIPs were obtained to evaluate the vascular anatomy. CONTRAST:  04/10/2021 OMNIPAQUE IOHEXOL 350 MG/ML SOLN COMPARISON:  None. FINDINGS: Cardiovascular: Satisfactory opacification of  pulmonary arteries noted, and no pulmonary  emboli identified. No evidence of thoracic aortic dissection or aneurysm. Mediastinum/Nodes: No masses or pathologically enlarged lymph nodes identified. Lungs/Pleura: Diffuse ground-glass pulmonary opacity is seen bilaterally, with multiple ill-defined focal areas of airspace opacity involving the upper lobes and right middle lobe. No evidence of solid pulmonary nodules or masses. No evidence of pleural effusion. Upper abdomen: No acute findings. Musculoskeletal: No suspicious bone lesions identified. Review of the MIP images confirms the above findings. IMPRESSION: No evidence of pulmonary embolism. Diffuse bilateral ground-glass pulmonary opacity with multifocal areas of airspace opacity, consistent with atypical infectious or inflammatory process. Electronically Signed   By: Danae Orleans M.D.   On: 02/08/2021 22:07   DG Chest Portable 1 View  Result Date: 02/08/2021 CLINICAL DATA:  Shortness of breath. EXAM: PORTABLE CHEST 1 VIEW COMPARISON:  08/23/2019 FINDINGS: Heart size is normal. Lungs are clear. No edema. Remote cervical fusion. IMPRESSION: No evidence for acute  abnormality. Electronically Signed   By: Norva Pavlov M.D.   On: 02/08/2021 17:24    Procedures Procedures   Medications Ordered in ED Medications  cefTRIAXone (ROCEPHIN) 1 g in sodium chloride 0.9 % 100 mL IVPB (1 g Intravenous New Bag/Given 02/08/21 2255)  azithromycin (ZITHROMAX) 500 mg in sodium chloride 0.9 % 250 mL IVPB (has no administration in time range)  iohexol (OMNIPAQUE) 350 MG/ML injection 100 mL (100 mLs Intravenous Contrast Given 02/08/21 2142)    ED Course  I have reviewed the triage vital signs and the nursing notes.  Pertinent labs & imaging results that were available during my care of the patient were reviewed by me and considered in my medical decision making (see chart for details).    MDM Rules/Calculators/A&P                           Patient presents with complaints of shortness of breath.  No  associated chest pain.  No ischemic changes noted on EKG.  His troponin is negative.  His chest x-ray is clear without evidence of pulmonary edema or pneumonia.  His BNP is normal.  His COVID/flu test are both negative.  His D-dimer was normal.  He said he still is feeling short of breath.  I ambulated him in the room and with minimal ambulation, his sats dropped to 87/88% on room air.  They normalized when he got back in bed and rested.  Given this with ongoing shortness of breath, a CT scan of his chest was ordered.  This shows multilobar opacities consistent with inflammation or atypical infection.  This potentially represents pneumonia.  He was started on Rocephin and Zithromax.  Given his hypoxia with ambulation, will admit him to the hospitalist service.  I spoke with Dr. Marilynn Rail who will admit the pt. Final Clinical Impression(s) / ED Diagnoses Final diagnoses:  Community acquired pneumonia, unspecified laterality    Rx / DC Orders ED Discharge Orders     None        Rolan Bucco, MD 02/08/21 2317    Rolan Bucco, MD 02/08/21 2322

## 2021-02-09 ENCOUNTER — Other Ambulatory Visit: Payer: Self-pay

## 2021-02-09 DIAGNOSIS — Z79899 Other long term (current) drug therapy: Secondary | ICD-10-CM | POA: Diagnosis not present

## 2021-02-09 DIAGNOSIS — Z7902 Long term (current) use of antithrombotics/antiplatelets: Secondary | ICD-10-CM | POA: Diagnosis not present

## 2021-02-09 DIAGNOSIS — J189 Pneumonia, unspecified organism: Principal | ICD-10-CM

## 2021-02-09 DIAGNOSIS — F17210 Nicotine dependence, cigarettes, uncomplicated: Secondary | ICD-10-CM | POA: Diagnosis present

## 2021-02-09 DIAGNOSIS — R0609 Other forms of dyspnea: Secondary | ICD-10-CM | POA: Diagnosis not present

## 2021-02-09 DIAGNOSIS — K219 Gastro-esophageal reflux disease without esophagitis: Secondary | ICD-10-CM | POA: Diagnosis present

## 2021-02-09 DIAGNOSIS — I252 Old myocardial infarction: Secondary | ICD-10-CM | POA: Diagnosis not present

## 2021-02-09 DIAGNOSIS — Z8249 Family history of ischemic heart disease and other diseases of the circulatory system: Secondary | ICD-10-CM | POA: Diagnosis not present

## 2021-02-09 DIAGNOSIS — Z7982 Long term (current) use of aspirin: Secondary | ICD-10-CM | POA: Diagnosis not present

## 2021-02-09 DIAGNOSIS — K760 Fatty (change of) liver, not elsewhere classified: Secondary | ICD-10-CM | POA: Diagnosis present

## 2021-02-09 DIAGNOSIS — E669 Obesity, unspecified: Secondary | ICD-10-CM | POA: Diagnosis present

## 2021-02-09 DIAGNOSIS — E785 Hyperlipidemia, unspecified: Secondary | ICD-10-CM | POA: Diagnosis present

## 2021-02-09 DIAGNOSIS — Z720 Tobacco use: Secondary | ICD-10-CM | POA: Diagnosis not present

## 2021-02-09 DIAGNOSIS — I4510 Unspecified right bundle-branch block: Secondary | ICD-10-CM | POA: Diagnosis present

## 2021-02-09 DIAGNOSIS — D6959 Other secondary thrombocytopenia: Secondary | ICD-10-CM | POA: Diagnosis present

## 2021-02-09 DIAGNOSIS — I10 Essential (primary) hypertension: Secondary | ICD-10-CM | POA: Diagnosis present

## 2021-02-09 DIAGNOSIS — Z955 Presence of coronary angioplasty implant and graft: Secondary | ICD-10-CM | POA: Diagnosis not present

## 2021-02-09 DIAGNOSIS — F4310 Post-traumatic stress disorder, unspecified: Secondary | ICD-10-CM | POA: Diagnosis present

## 2021-02-09 DIAGNOSIS — I2510 Atherosclerotic heart disease of native coronary artery without angina pectoris: Secondary | ICD-10-CM | POA: Diagnosis present

## 2021-02-09 DIAGNOSIS — Z20822 Contact with and (suspected) exposure to covid-19: Secondary | ICD-10-CM | POA: Diagnosis present

## 2021-02-09 DIAGNOSIS — R7303 Prediabetes: Secondary | ICD-10-CM | POA: Diagnosis present

## 2021-02-09 DIAGNOSIS — E876 Hypokalemia: Secondary | ICD-10-CM | POA: Diagnosis present

## 2021-02-09 DIAGNOSIS — Z806 Family history of leukemia: Secondary | ICD-10-CM | POA: Diagnosis not present

## 2021-02-09 DIAGNOSIS — Z6837 Body mass index (BMI) 37.0-37.9, adult: Secondary | ICD-10-CM | POA: Diagnosis not present

## 2021-02-09 DIAGNOSIS — G4733 Obstructive sleep apnea (adult) (pediatric): Secondary | ICD-10-CM | POA: Diagnosis present

## 2021-02-09 LAB — CBC WITH DIFFERENTIAL/PLATELET
Abs Immature Granulocytes: 0.01 10*3/uL (ref 0.00–0.07)
Basophils Absolute: 0 10*3/uL (ref 0.0–0.1)
Basophils Relative: 1 %
Eosinophils Absolute: 0.1 10*3/uL (ref 0.0–0.5)
Eosinophils Relative: 2 %
HCT: 36.7 % — ABNORMAL LOW (ref 39.0–52.0)
Hemoglobin: 12.5 g/dL — ABNORMAL LOW (ref 13.0–17.0)
Immature Granulocytes: 0 %
Lymphocytes Relative: 13 %
Lymphs Abs: 0.8 10*3/uL (ref 0.7–4.0)
MCH: 32.6 pg (ref 26.0–34.0)
MCHC: 34.1 g/dL (ref 30.0–36.0)
MCV: 95.6 fL (ref 80.0–100.0)
Monocytes Absolute: 0.4 10*3/uL (ref 0.1–1.0)
Monocytes Relative: 7 %
Neutro Abs: 4.7 10*3/uL (ref 1.7–7.7)
Neutrophils Relative %: 77 %
Platelets: 84 10*3/uL — ABNORMAL LOW (ref 150–400)
RBC: 3.84 MIL/uL — ABNORMAL LOW (ref 4.22–5.81)
RDW: 15.6 % — ABNORMAL HIGH (ref 11.5–15.5)
WBC: 6 10*3/uL (ref 4.0–10.5)
nRBC: 0 % (ref 0.0–0.2)

## 2021-02-09 LAB — RESPIRATORY PANEL BY PCR

## 2021-02-09 LAB — BASIC METABOLIC PANEL
Anion gap: 8 (ref 5–15)
BUN: <5 mg/dL — ABNORMAL LOW (ref 6–20)
CO2: 29 mmol/L (ref 22–32)
Calcium: 8.3 mg/dL — ABNORMAL LOW (ref 8.9–10.3)
Chloride: 101 mmol/L (ref 98–111)
Creatinine, Ser: 0.97 mg/dL (ref 0.61–1.24)
GFR, Estimated: >60 mL/min (ref >60)
Glucose, Bld: 99 mg/dL (ref 70–99)
Potassium: 3.8 mmol/L (ref 3.5–5.1)
Sodium: 138 mmol/L (ref 135–145)

## 2021-02-09 LAB — HIV ANTIBODY (ROUTINE TESTING W REFLEX): HIV Screen 4th Generation wRfx: NONREACTIVE

## 2021-02-09 LAB — PROCALCITONIN: Procalcitonin: 0.1 ng/mL

## 2021-02-09 LAB — HEMOGLOBIN A1C
Hgb A1c MFr Bld: 6 % — ABNORMAL HIGH (ref 4.8–5.6)
Mean Plasma Glucose: 125.5 mg/dL

## 2021-02-09 LAB — MAGNESIUM: Magnesium: 2.1 mg/dL (ref 1.7–2.4)

## 2021-02-09 LAB — STREP PNEUMONIAE URINARY ANTIGEN: Strep Pneumo Urinary Antigen: NEGATIVE

## 2021-02-09 MED ORDER — VITAMIN D 25 MCG (1000 UNIT) PO TABS
2000.0000 [IU] | ORAL_TABLET | Freq: Every day | ORAL | Status: DC
  Administered 2021-02-10: 2000 [IU] via ORAL
  Filled 2021-02-09: qty 2

## 2021-02-09 MED ORDER — CLOPIDOGREL BISULFATE 75 MG PO TABS: 37.5000 mg | ORAL_TABLET | Freq: Every day | ORAL | Status: DC

## 2021-02-09 MED ORDER — SODIUM CHLORIDE 0.9 % IV SOLN: 250.0000 mL | INTRAVENOUS | Status: DC | PRN

## 2021-02-09 MED ORDER — SERTRALINE HCL 100 MG PO TABS
100.0000 mg | ORAL_TABLET | Freq: Every day | ORAL | Status: DC
  Administered 2021-02-10: 100 mg via ORAL
  Filled 2021-02-09: qty 1

## 2021-02-09 MED ORDER — SODIUM CHLORIDE 0.9 % IV SOLN
2.0000 g | INTRAVENOUS | Status: DC
  Administered 2021-02-09: 2 g via INTRAVENOUS
  Filled 2021-02-09: qty 20

## 2021-02-09 MED ORDER — POTASSIUM CHLORIDE CRYS ER 20 MEQ PO TBCR
20.0000 meq | EXTENDED_RELEASE_TABLET | Freq: Once | ORAL | Status: AC
  Administered 2021-02-09: 20 meq via ORAL
  Filled 2021-02-09: qty 1

## 2021-02-09 MED ORDER — CLOPIDOGREL BISULFATE 75 MG PO TABS
75.0000 mg | ORAL_TABLET | Freq: Every day | ORAL | Status: DC
  Administered 2021-02-10: 75 mg via ORAL
  Filled 2021-02-09: qty 1

## 2021-02-09 MED ORDER — SODIUM CHLORIDE 0.9% FLUSH: 3.0000 mL | INTRAVENOUS | Status: DC | PRN

## 2021-02-09 MED ORDER — TAB-A-VITE/IRON PO TABS
1.0000 | ORAL_TABLET | Freq: Every day | ORAL | Status: DC
  Administered 2021-02-10: 1 via ORAL
  Filled 2021-02-09: qty 1

## 2021-02-09 MED ORDER — ASPIRIN 81 MG PO CHEW
81.0000 mg | CHEWABLE_TABLET | Freq: Every day | ORAL | Status: DC
  Administered 2021-02-10: 81 mg via ORAL
  Filled 2021-02-09: qty 1

## 2021-02-09 MED ORDER — METOPROLOL TARTRATE 12.5 MG HALF TABLET
12.5000 mg | ORAL_TABLET | Freq: Two times a day (BID) | ORAL | Status: DC
  Administered 2021-02-09 – 2021-02-10 (×2): 12.5 mg via ORAL
  Filled 2021-02-09 (×2): qty 1

## 2021-02-09 MED ORDER — SODIUM CHLORIDE 0.9 % IV SOLN: 500.0000 mg | INTRAVENOUS | Status: DC

## 2021-02-09 MED ORDER — SODIUM CHLORIDE 0.9 % IV SOLN
500.0000 mg | INTRAVENOUS | Status: DC
  Administered 2021-02-09: 500 mg via INTRAVENOUS
  Filled 2021-02-09 (×2): qty 500

## 2021-02-09 MED ORDER — BUTALBITAL-APAP-CAFFEINE 50-325-40 MG PO TABS: 2.0000 | ORAL_TABLET | Freq: Two times a day (BID) | ORAL | Status: DC | PRN

## 2021-02-09 MED ORDER — LACTATED RINGERS IV SOLN: INTRAVENOUS | Status: DC

## 2021-02-09 MED ORDER — SODIUM CHLORIDE 0.9% FLUSH
3.0000 mL | Freq: Two times a day (BID) | INTRAVENOUS | Status: DC
  Administered 2021-02-09 – 2021-02-10 (×3): 3 mL via INTRAVENOUS

## 2021-02-09 MED ORDER — SODIUM CHLORIDE 0.9 % IV SOLN: 2.0000 g | INTRAVENOUS | Status: DC

## 2021-02-09 MED ORDER — METHOCARBAMOL 500 MG PO TABS: 500.0000 mg | ORAL_TABLET | Freq: Every day | ORAL | Status: DC | PRN

## 2021-02-09 MED ORDER — ATORVASTATIN CALCIUM 40 MG PO TABS
40.0000 mg | ORAL_TABLET | Freq: Every day | ORAL | Status: DC
  Administered 2021-02-10: 40 mg via ORAL
  Filled 2021-02-09: qty 1

## 2021-02-09 MED ORDER — NITROGLYCERIN 0.4 MG SL SUBL: 0.4000 mg | SUBLINGUAL_TABLET | SUBLINGUAL | Status: DC | PRN

## 2021-02-09 MED ORDER — BUSPIRONE HCL 5 MG PO TABS: 7.5000 mg | ORAL_TABLET | Freq: Every day | ORAL | Status: DC | PRN

## 2021-02-09 MED ORDER — PANTOPRAZOLE SODIUM 40 MG PO TBEC
40.0000 mg | DELAYED_RELEASE_TABLET | Freq: Every day | ORAL | Status: DC
  Administered 2021-02-10: 40 mg via ORAL
  Filled 2021-02-09: qty 1

## 2021-02-09 NOTE — ED Notes (Signed)
Pt was getting uncomfortable in bed with his back. Brought a recliner for patient to sit in. States that he is much more comfortable at this moment.

## 2021-02-09 NOTE — Progress Notes (Signed)
Pt arrived to floor around 1325 via stretcher by [facility]. Pt amb from stretcher to hospital bed with steady gait. VSS. Respirations even and unlabored on room air. Mild dyspnea noted with exertion. Assessment completed. Skin assessment completed with Erick Alley, RN. Pt oriented to room. Pt encouraged to use call bell for assistance if needed. Call bell within reach. Bed in low position.

## 2021-02-09 NOTE — H&P (Addendum)
History and Physical    [name] PTW:[id number] DOB: 05/07/1966 DOA: 02/08/2021  PCP: sees PCP at [facility] Consultants:  cardiology: Dr. Flora Lipps  Patient coming from: [place]. Lives at Home with wife and kids   Chief Complaint: shortness of breath   HPI: Kenneth Mckee is a 55 y.o. male with medical history significant of OSA, CAD with NSTEMI in 2021 with PCI to mid LCX, hyperlipidemia, prediabetes, tobacco abuse, thrombocytopenia, NAFLD who presented to ED with 4-day history of shortness of breath with exertion only. He denies any chest pain, palpitations, coughing, chest tightness, leg swelling. He denies any fever/chills. He does have a history of smoking 1PPD x 30 years. He has been around his son who had a respiratory virus x 2 weeks.   Environmental exposure: [place], unsure if any exposures of any kind.   Denies any N/V/D, stomach pain, headaches, vision changes, rashes, dysuria or other GU symptoms.   ED Course: vitals: afebrile, bp: 118/72, HR: 64, RR: 18, oxygen: 100% RA. Did drop to 88% with ambulation. Pertinent labs: hgb 12.1, platelets 89 (67-109), troponin within normal limits x2, potassium 3.4, AST: 67 Chest x-ray: No acute finding CTA: No evidence of PE, diffuse bilateral groundglass pulmonary opacity with multifocal areas of airspace opacity consistent with atypical infectious or inflammatory process. In ED given Rocephin and Zithromax.  TRH was asked to admit.  Review of Systems: As per HPI; otherwise review of systems reviewed and negative.   Ambulatory Status:  Ambulates with a cane at times.    Past Medical History:  Diagnosis Date   Arm vein blood clot    right arm   Arthritis    Chickenpox    Coronary artery disease    GERD (gastroesophageal reflux disease)    NAFLD (nonalcoholic fatty liver disease)    NSTEMI (non-ST elevated myocardial infarction) (HCC) 08/23/2019   Phlebitis    right wrist artery- so small. Artery so far down  no surgery indicated.  Was on coumadin for 6 months in 2004   PTSD (post-traumatic stress disorder)    Sleep apnea 2004-2006   sleep study (severe)' wears BiPap nightly   Umbilical hernia     Past Surgical History:  Procedure Laterality Date   ANTERIOR CERVICAL DECOMP/DISCECTOMY FUSION N/A 02/13/2013   Procedure: ANTERIOR CERVICAL DECOMPRESSION/DISCECTOMY FUSION Cervical Three-Four, Four-Five, Five-Six ;  Surgeon: Temple Pacini, MD;  Location: [facility] NEURO ORS;  Service: Neurosurgery;  Laterality: N/A;  ANTERIOR CERVICAL DECOMPRESSION/DISCECTOMY FUSION 3 LEVELS   CERVICAL DISCECTOMY  2005   CHOLECYSTECTOMY N/A 09/03/2012   Procedure: LAPAROSCOPIC CHOLECYSTECTOMY WITH INTRAOPERATIVE CHOLANGIOGRAM;  Surgeon: Liz Malady, MD;  Location: [facility] OR;  Service: General;  Laterality: N/A;   HERNIA REPAIR  2013   epigastric hernia repair w/mesh   LEFT HEART CATH AND CORONARY ANGIOGRAPHY N/A 08/24/2019   Procedure: LEFT HEART CATH AND CORONARY ANGIOGRAPHY;  Surgeon: Iran Ouch, MD;  Location: [facility] INVASIVE CV LAB;  Service: Cardiovascular;  Laterality: N/A;   NASAL SINUS SURGERY     TONSILLECTOMY     UPPER GI ENDOSCOPY      Social History   Socioeconomic History   Marital status: Married    Spouse name: Not on file   Number of children: 1   Years of education: Not on file   Highest education level: Not on file  Occupational History   Occupation: Repairman    Employer: [employer]  Tobacco Use   Smoking status: Every Day    Packs/day:  1.00    Years: 25.00    Pack years: 25.00    Types: Cigarettes   Smokeless tobacco: Never  Vaping Use   Vaping Use: Never used  Substance and Sexual Activity   Alcohol use: No    Comment: 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   Drug use: No    Comment: remote marijuana use    Sexual activity: Not on file  Other Topics Concern   Not on file  Social History Narrative   Regular exercise - no    Social Determinants of Health   Financial Resource Strain: Not on  file  Food Insecurity: Not on file  Transportation Needs: Not on file  Physical Activity: Not on file  Stress: Not on file  Social Connections: Not on file  Intimate Partner Violence: Not on file    Allergies  Allergen Reactions   Penicillins Other (See Comments)    From childhood   Sumatriptan Other (See Comments)    Dizziness (intolerance)    Family History  Problem Relation Age of Onset   Heart attack Father 81   Leukemia Paternal Grandfather     Prior to Admission medications   Medication Sig Start Date End Date Taking? Authorizing Provider  aspirin 81 MG chewable tablet Chew 1 tablet (81 mg total) by mouth daily. 10/02/19   O'NealRonnald Ramp, MD  atorvastatin (LIPITOR) 40 MG tablet Take 1 tablet (40 mg total) by mouth daily at 6 PM. 10/02/19   O'Neal, Ronnald Ramp, MD  Benzoyl Peroxide 10 % LIQD Apply 1 application topically daily. Use to wash affected area 04/16/19   [provider]  busPIRone (BUSPAR) 15 MG tablet Take 7.5 mg by mouth as needed.  05/25/19   [provider]  Cholecalciferol 50 MCG (2000 UT) TABS Take 1 tablet by mouth daily. 06/27/19   [provider]  clindamycin (CLEOCIN T) 1 % external solution Apply 1 application topically daily. 04/16/19   [provider]  clopidogrel (PLAVIX) 75 MG tablet Take 1 tablet (75 mg total) by mouth daily with breakfast. 10/02/19   O'Neal, Ronnald Ramp, MD  cyclobenzaprine (FLEXERIL) 10 MG tablet Take 10 mg by mouth as needed for muscle spasms. 05/24/19   [provider]  doxycycline (MONODOX) 100 MG capsule Take 100 mg by mouth as needed (for flare ups).  04/15/19   [provider]  Meclizine HCl 25 MG CHEW Chew 25 mg by mouth as needed for dizziness. 11/21/18   [provider]  methocarbamol (ROBAXIN) 500 MG tablet Take 500 mg by mouth as needed for muscle spasms. 05/24/19   [provider]  metoprolol tartrate (LOPRESSOR) 25 MG tablet Take 0.5 tablets (12.5  mg total) by mouth 2 (two) times daily. 10/02/19   O'NealRonnald Ramp, MD  Multiple Vitamins-Iron TABS Take 1 tablet by mouth daily.    [provider]  nitroGLYCERIN (NITROSTAT) 0.4 MG SL tablet Place 1 tablet (0.4 mg total) under the tongue every 5 (five) minutes x 3 doses as needed for chest pain. 08/25/19   Furth, Cadence H, PA-C  pantoprazole (PROTONIX) 40 MG tablet Take 40 mg by mouth.    [provider]  promethazine (PHENERGAN) 25 MG tablet Take 25 mg by mouth as needed for nausea. 06/05/19   [provider]  rizatriptan (MAXALT) 5 MG tablet Take 2.5 mg by mouth as needed for migraine. 06/06/19   [provider]  varenicline (CHANTIX STARTING MONTH PAK) 0.5 MG X 11 &  1 MG X 42 tablet Take one 0.5 mg tablet by mouth once daily for 3 days, then increase to one 0.5 mg tablet twice daily for 4 days, then increase to one 1 mg tablet twice daily. 09/03/19   Beatriz Stallion., PA-C    Physical Exam: Vitals:   02/09/21 1158 02/09/21 1220 02/09/21 1245 02/09/21 1332  BP:    128/85  Pulse: 69 78 63 62  Resp:  (!) 22 14 13   Temp:    97.7 F (36.5 C)  TempSrc:    Oral  SpO2: 95% 95% 95% 95%  Weight:    129.6 kg  Height:    6\' 1"  (1.854 m)     General:  Appears calm and comfortable and is in NAD Eyes:  PERRL, EOMI, normal lids, iris ENT:  grossly normal hearing, lips & tongue, mmm; appropriate dentition Neck:  no LAD, masses or thyromegaly; no carotid bruits Cardiovascular:  RRR, no m/r/g. No LE edema.  Respiratory:   crackles in bibasilar lungs. No wheezing. Good air movement.  Normal respiratory effort. Abdomen:  soft, NT, ND, NABS Back:   normal alignment, no CVAT Skin:  no rash or induration seen on limited exam. Sebaceous cyst on back  Musculoskeletal:  grossly normal tone BUE/BLE, good ROM, no bony abnormality Lower extremity:  No LE edema.  Limited foot exam with no ulcerations.  2+ distal pulses. Psychiatric:  grossly normal mood and affect,  speech fluent and appropriate, AOx3 Neurologic:  CN 2-12 grossly intact, moves all extremities in coordinated fashion, sensation intact    Radiological Exams on Admission: Independently reviewed - see discussion in A/P where applicable  CT Angio Chest PE W/Cm &/Or Wo Cm  Result Date: 02/08/2021 CLINICAL DATA:  Shortness of breath for 5 days. Hypoxia. High probability for pulmonary embolism. EXAM: CT ANGIOGRAPHY CHEST WITH CONTRAST TECHNIQUE: Multidetector CT imaging of the chest was performed using the standard protocol during bolus administration of intravenous contrast. Multiplanar CT image reconstructions and MIPs were obtained to evaluate the vascular anatomy. CONTRAST:  OMNIPAQUE IOHEXOL 350 MG/ML SOLN COMPARISON:  None. FINDINGS: Cardiovascular: Satisfactory opacification of pulmonary arteries noted, and no pulmonary emboli identified. No evidence of thoracic aortic dissection or aneurysm. Mediastinum/Nodes: No masses or pathologically enlarged lymph nodes identified. Lungs/Pleura: Diffuse ground-glass pulmonary opacity is seen bilaterally, with multiple ill-defined focal areas of airspace opacity involving the upper lobes and right middle lobe. No evidence of solid pulmonary nodules or masses. No evidence of pleural effusion. Upper abdomen: No acute findings. Musculoskeletal: No suspicious bone lesions identified. Review of the MIP images confirms the above findings. IMPRESSION: No evidence of pulmonary embolism. Diffuse bilateral ground-glass pulmonary opacity with multifocal areas of airspace opacity, consistent with atypical infectious or inflammatory process. Electronically Signed   By: 04/10/2021 M.D.   On: 02/08/2021 22:07   DG Chest Portable 1 View  Result Date: 02/08/2021 CLINICAL DATA:  Shortness of breath. EXAM: PORTABLE CHEST 1 VIEW COMPARISON:  08/23/2019 FINDINGS: Heart size is normal. Lungs are clear. No edema. Remote cervical fusion. IMPRESSION: No evidence for acute   abnormality. Electronically Signed   By: 04/10/2021 M.D.   On: 02/08/2021 17:24    EKG: Independently reviewed.  NSR with rate 59, RBBB; nonspecific ST changes with no evidence of acute ischemia   Labs on Admission: I have personally reviewed the available labs and imaging studies at the time of the admission.  Pertinent labs:  hgb 12.1,  platelets 89 (67-109),  troponin within  normal limits x2,  potassium 3.4,  AST: 67    Assessment/Plan Principal Problem:   CAP (community acquired pneumonia) -Patient presenting dyspnea on exertion, mildly decreased oxygen saturation with ambulation, bibasilar crackles on exam and CTA findings to suggest atypical infection or inflammatory process. -Admit to floor - will continue Rocephin and azithromycin for community-acquired pneumonia at this time; however patient has no white count, fever or cough.  More suspicious for viral infection versus inflammatory process especially with son having similar symptoms of viral infection.  Procalcitonin has been ordered to help in decision-making process and to tailor antibiotics.  If no improvement pulmonary may need to be consulted. -blood cultures  -urinary antigens -Respiratory virus panel ordered to include pertussis -SABA as needed -Light IV fluids -Pneumonia Severity Index (PSI) is Class 3, 1% mortality. -RT for oxygen ambulatory test  Active Problems: Hypokalemia Check mag Replete x1 Follow  CAD (coronary artery disease) -s/p NSTEMI in 08/2019 with PCI to mid LCX and 99% D1 lesion but small vessel. Declined staged PCI -Continue metoprolol 25 mg twice daily and Lipitor 40 mg daily -Continue aspirin. Still on plavix although has been >1 year. No f/u with cardiologist.  -Discussed he is way overdue to see his cardiologist and recommended outpatient follow-up soon.    OSA treated with BiPAP -Continue BiPAP at night    PREDIABETES -a1c 5.6 in 4/21, repeat today -carb modified diet     Tobacco abuse Was smoking 1PPD. Has not smoked in one week declines nicotine patch    HLD (hyperlipidemia) -Continue statin  Thrombocyopenia   -At baseline, will hold VTE prophylaxis due to  platelets <100K. Has seen hematologist at [facility] and told secondary to NAFLD.  -SCDs -Follow daily  Anxiety Continue daily zoloft and prn buspar  GERD Continue protonix   Body mass index is 37.7 kg/m.   Level of care: Telemetry Medical DVT prophylaxis:  SCDs Code Status:  Full - confirmed with patient Family Communication: None present Disposition Plan:  The patient is from: home  Anticipated d/c is to: home   Requires inpatient hospitalization and is at significant risk of worsening, requires constant monitoring, assessment and oxygen.  Patient is currently: stable Consults called: none  Admission status:  observation   Dragon dictation used in completing this note.    Orland Mustard MD [facility]   How to contact the [facility] Attending or Consulting provider 7A - 7P or covering provider during after hours 7P -7A, for this patient?  Check the care team in [facility] and look for a) attending/consulting [facility] provider listed and b) the [facility] team listed Log into [email or web address] and use 's universal password to access. If you do not have the password, please contact the hospital operator. Locate the [facility] provider you are looking for under [facility] and page to a number that you can be directly reached. If you still have difficulty reaching the provider, please page the [facility] (Director on Call) for the Hospitalists listed on amion for assistance.   02/09/2021, 2:49 PM

## 2021-02-09 NOTE — Progress Notes (Signed)
Transfer from [facility] to [facility].   Requesting provider: Dr. Fredderick Phenix (EDP at [facility]) Reason for transfer: admission for further evaluation and management of community-acquired pneumonia.   55 year old history of obstructive sleep apneayear old , who presented to [facility] emergency department ED on 02/08/21 complaining of 3 days of progressive shortness of breath associated with new onset cough.   Will maintaining oxygen saturations at rest in the mid 90s on room air, he was noted to desaturate into the range of 86 to 87% of air with ambulation at [facility] ED. this is in the context of no known previous supplemental oxygen requirements.  Chest x-ray without acute cardiopulmonary process.  Considering CTA chest notable for bilateral airspace opacities suggestive of infection versus inflammatory process.  Started on azithromycin and Rocephin for coverage of community-acquired pneumonia.  COVID-19/influenza PCR negative.   Subsequently, I accepted this patient for transfer for admission to a med telemetry bed at [facility] for further work-up and management of community-acquired pneumonia.     Newton Pigg, DO Hospitalist

## 2021-02-09 NOTE — Progress Notes (Signed)
SATURATION QUALIFICATIONS: (This note is used to comply with regulatory documentation for home oxygen)  Patient Saturations on Room Air at Rest = 98%  Patient Saturations on Room Air while Ambulating = 88%  Patient Saturations on 2 Liters of oxygen while Ambulating = 97%  Please briefly explain why patient needs home oxygen:

## 2021-02-10 ENCOUNTER — Other Ambulatory Visit: Payer: Self-pay | Admitting: Medical

## 2021-02-10 DIAGNOSIS — R0609 Other forms of dyspnea: Secondary | ICD-10-CM

## 2021-02-10 DIAGNOSIS — I2510 Atherosclerotic heart disease of native coronary artery without angina pectoris: Secondary | ICD-10-CM

## 2021-02-10 DIAGNOSIS — Z720 Tobacco use: Secondary | ICD-10-CM

## 2021-02-10 LAB — CBC WITH DIFFERENTIAL/PLATELET
Abs Immature Granulocytes: 0.02 10*3/uL (ref 0.00–0.07)
Basophils Absolute: 0 10*3/uL (ref 0.0–0.1)
Basophils Relative: 1 %
Eosinophils Absolute: 0.1 10*3/uL (ref 0.0–0.5)
Eosinophils Relative: 2 %
HCT: 36 % — ABNORMAL LOW (ref 39.0–52.0)
Hemoglobin: 11.9 g/dL — ABNORMAL LOW (ref 13.0–17.0)
Immature Granulocytes: 0 %
Lymphocytes Relative: 13 %
Lymphs Abs: 0.9 10*3/uL (ref 0.7–4.0)
MCH: 32 pg (ref 26.0–34.0)
MCHC: 33.1 g/dL (ref 30.0–36.0)
MCV: 96.8 fL (ref 80.0–100.0)
Monocytes Absolute: 0.6 10*3/uL (ref 0.1–1.0)
Monocytes Relative: 9 %
Neutro Abs: 5 10*3/uL (ref 1.7–7.7)
Neutrophils Relative %: 75 %
Platelets: 83 10*3/uL — ABNORMAL LOW (ref 150–400)
RBC: 3.72 MIL/uL — ABNORMAL LOW (ref 4.22–5.81)
RDW: 15.7 % — ABNORMAL HIGH (ref 11.5–15.5)
WBC: 6.6 10*3/uL (ref 4.0–10.5)
nRBC: 0 % (ref 0.0–0.2)

## 2021-02-10 LAB — BASIC METABOLIC PANEL
Anion gap: 9 (ref 5–15)
BUN: <5 mg/dL — ABNORMAL LOW (ref 6–20)
CO2: 27 mmol/L (ref 22–32)
Calcium: 8.2 mg/dL — ABNORMAL LOW (ref 8.9–10.3)
Chloride: 102 mmol/L (ref 98–111)
Creatinine, Ser: 1.02 mg/dL (ref 0.61–1.24)
GFR, Estimated: >60 mL/min (ref >60)
Glucose, Bld: 96 mg/dL (ref 70–99)
Potassium: 3.3 mmol/L — ABNORMAL LOW (ref 3.5–5.1)
Sodium: 138 mmol/L (ref 135–145)

## 2021-02-10 LAB — PROCALCITONIN: Procalcitonin: <0.1 ng/mL

## 2021-02-10 LAB — LEGIONELLA PNEUMOPHILA SEROGP 1 UR AG: L. pneumophila Serogp 1 Ur Ag: NEGATIVE

## 2021-02-10 LAB — SARS CORONAVIRUS 2 (TAT 6-24 HRS): SARS Coronavirus 2: NEGATIVE

## 2021-02-10 LAB — C-REACTIVE PROTEIN: CRP: 2.3 mg/dL — ABNORMAL HIGH (ref <1.0)

## 2021-02-10 LAB — BRAIN NATRIURETIC PEPTIDE: B Natriuretic Peptide: 62.8 pg/mL (ref 0.0–100.0)

## 2021-02-10 LAB — D-DIMER, QUANTITATIVE: D-Dimer, Quant: 0.45 ug/mL-FEU (ref 0.00–0.50)

## 2021-02-10 MED ORDER — POTASSIUM CHLORIDE CRYS ER 20 MEQ PO TBCR
40.0000 meq | EXTENDED_RELEASE_TABLET | Freq: Once | ORAL | Status: AC
  Administered 2021-02-10: 40 meq via ORAL
  Filled 2021-02-10: qty 2

## 2021-02-10 MED ORDER — AZITHROMYCIN 500 MG PO TABS: 500.0000 mg | ORAL_TABLET | Freq: Every day | ORAL | 0 refills | Status: AC

## 2021-02-10 NOTE — Discharge Summary (Signed)
Kenneth Mckee HFW:[id number] DOB: 1966-04-23 DOA: 02/08/2021  PCP: Default, Provider, MD  Admit date: 02/08/2021  Discharge date: 02/10/2021  Admitted From: Home   Disposition:  Home   Recommendations for Outpatient Follow-up:   Follow up with PCP in 1-2 weeks  PCP Please obtain BMP/CBC, 2 view CXR in 1week,  (see Discharge instructions)   PCP Please follow up on the following pending results: check CBC, CMP, CXR in 1 week   Home Health: None    Equipment/Devices: None  Consultations: Cards Discharge Condition: Stable    CODE STATUS: Full    Diet Recommendation: Heart Healthy Low Carb  Diet Order             Diet Carb Modified Fluid consistency: Thin; Room service appropriate? Yes  Diet effective now                    Chief Complaint  Patient presents with   Shortness of Breath     Brief history of present illness from the day of admission and additional interim summary    Kenneth Mckee is a 55 y.o. male with medical history significant of OSA, CAD with NSTEMI in 2021 with PCI to mid LCX, hyperlipidemia, prediabetes, tobacco abuse, thrombocytopenia, NAFLD who presented to ED with 4-day history of shortness of breath with exertion                                                                  Hospital Course    Exertional SOB in a patient with CAD s/p NSTEMI and PCI to left circumflex in 2021-although his CT scan suggests atypical infection - he was seen by Cards and cleared for Home DC, per patient he is symptom free, ambulatory POX stable, EKG and Trop negative, will get total 5 days of PO Azithro, outpt follow with PCP and Cards.   2.  Obesity BMI of 37 with OSA.  Follow with PCP for weight loss nighttime CPAP.   3.  CAD s/p PCI in the past.  Continue dual antiplatelet therapy, beta-blocker  and statin for secondary prevention.  Kindly see #1 above.   4.  Ongoing smoking.  Counseled to quit.   5.  History of heavy alcohol abuse in the past.  Fatty liver per recent liver ultrasound, mild thrombocytopenia with elevated AST.  Denies any ongoing alcohol use, PCP to monitor.   6.  Dyslipidemia.  On statin.   7.  Mild chronic thrombocytopenia.  PCP to evaluate with remote history of alcohol abuse, fatty liver on ultrasound and elevated AST.   8.  GERD.  On PPI.   Discharge diagnosis     Principal Problem:   CAP (community acquired pneumonia) Active Problems:   OSA treated with BiPAP   PREDIABETES  CAD (coronary artery disease)   Tobacco abuse   HLD (hyperlipidemia)    Discharge instructions    Discharge Instructions     Discharge instructions   Complete by: As directed    Follow with Primary MD and your Cardiologist in 7 days   Get CBC, CMP, 2 view Chest X ray -  checked next visit within 1 week by Primary MD   Activity: As tolerated with Full fall precautions use walker/cane & assistance as needed  Disposition Home   Diet: Heart Healthy Low Carb  Special Instructions: If you have smoked or chewed Tobacco  in the last 2 yrs please stop smoking, stop any regular Alcohol  and or any Recreational drug use.  On your next visit with your primary care physician please Get Medicines reviewed and adjusted.  Please request your Prim.MD to go over all Hospital Tests and Procedure/Radiological results at the follow up, please get all Hospital records sent to your Prim MD by signing hospital release before you go home.  If you experience worsening of your admission symptoms, develop shortness of breath, life threatening emergency, suicidal or homicidal thoughts you must seek medical attention immediately by calling 911 or calling your MD immediately  if symptoms less severe.  You Must read complete instructions/literature along with all the possible adverse  reactions/side effects for all the Medicines you take and that have been prescribed to you. Take any new Medicines after you have completely understood and accpet all the possible adverse reactions/side effects.   Increase activity slowly   Complete by: As directed        Discharge Medications   Allergies as of 02/10/2021       Reactions   Maxalt [rizatriptan] Other (See Comments)   unknown   Penicillins Other (See Comments)   From childhood   Sumatriptan Other (See Comments)   Dizziness (intolerance)        Medication List     TAKE these medications    aspirin 81 MG chewable tablet Chew 1 tablet (81 mg total) by mouth daily.   atorvastatin 40 MG tablet Commonly known as: LIPITOR Take 1 tablet (40 mg total) by mouth daily at 6 PM.   azithromycin 500 MG tablet Commonly known as: Zithromax Take 1 tablet (500 mg total) by mouth daily.   busPIRone 15 MG tablet Commonly known as: BUSPAR Take 7.5 mg by mouth daily as needed (anxiety).   butalbital-acetaminophen-caffeine 50-325-40 MG tablet Commonly known as: FIORICET Take 2 tablets by mouth 2 (two) times daily as needed for migraine.   Chantix Starting Month Pak 0.5 MG X 11 & 1 MG X 42 tablet Generic drug: varenicline Take one 0.5 mg tablet by mouth once daily for 3 days, then increase to one 0.5 mg tablet twice daily for 4 days, then increase to one 1 mg tablet twice daily.   chlorhexidine 4 % external liquid Commonly known as: HIBICLENS Apply 1 application topically daily as needed (body wash).   Cholecalciferol 50 MCG (2000 UT) Tabs Take 2,000 Units by mouth daily.   clindamycin 1 % external solution Commonly known as: CLEOCIN T Apply 1 application topically daily as needed (skin irritation).   clobetasol ointment 0.05 % Commonly known as: TEMOVATE Apply 1 application topically daily as needed (skin irritation).   clopidogrel 75 MG tablet Commonly known as: PLAVIX Take 1 tablet (75 mg total) by mouth  daily with breakfast.   cyclobenzaprine 10 MG tablet Commonly known as: FLEXERIL Take 10 mg by  mouth daily as needed for muscle spasms.   Meclizine HCl 25 MG Chew Chew 25 mg by mouth daily as needed for dizziness.   methocarbamol 500 MG tablet Commonly known as: ROBAXIN Take 500 mg by mouth daily as needed for muscle spasms.   metoprolol tartrate 25 MG tablet Commonly known as: LOPRESSOR Take 0.5 tablets (12.5 mg total) by mouth 2 (two) times daily.   Multiple Vitamins-Iron Tabs Take 1 tablet by mouth daily.   nitroGLYCERIN 0.4 MG SL tablet Commonly known as: NITROSTAT Place 1 tablet (0.4 mg total) under the tongue every 5 (five) minutes x 3 doses as needed for chest pain.   pantoprazole 40 MG tablet Commonly known as: PROTONIX Take 40 mg by mouth daily.   promethazine 25 MG tablet Commonly known as: PHENERGAN Take 25 mg by mouth daily as needed for nausea or vomiting.   sertraline 100 MG tablet Commonly known as: ZOLOFT Take 100 mg by mouth daily.         Follow-up Information     PCP. Schedule an appointment as soon as possible for a visit in 1 week(s).          Alver Sorrow, NP Follow up on 02/13/2021.   Specialty: Cardiology Why: Please arrive 15 mintues early for your 8am post-hospital follow-up. Please do not eat after midnight prior to this visit so we can check fasting blood work. Contact information: [address] [place] [address] [phone]         [facility] Follow up on 02/17/2021.   Specialty: Cardiology Why: Please arrive 15 minutes early for your 2pm echocardiogram appointment (ultrasound of your heart) Contact information: [address] [place] [address] [phone]                Major procedures and Radiology Reports - PLEASE review detailed and final reports thoroughly  -        CT Angio Chest PE W/Cm &/Or Wo Cm  Result Date:  02/08/2021 CLINICAL DATA:  Shortness of breath for 5 days. Hypoxia. High probability for pulmonary embolism. EXAM: CT ANGIOGRAPHY CHEST WITH CONTRAST TECHNIQUE: Multidetector CT imaging of the chest was performed using the standard protocol during bolus administration of intravenous contrast. Multiplanar CT image reconstructions and MIPs were obtained to evaluate the vascular anatomy. CONTRAST:  OMNIPAQUE IOHEXOL 350 MG/ML SOLN COMPARISON:  None. FINDINGS: Cardiovascular: Satisfactory opacification of pulmonary arteries noted, and no pulmonary emboli identified. No evidence of thoracic aortic dissection or aneurysm. Mediastinum/Nodes: No masses or pathologically enlarged lymph nodes identified. Lungs/Pleura: Diffuse ground-glass pulmonary opacity is seen bilaterally, with multiple ill-defined focal areas of airspace opacity involving the upper lobes and right middle lobe. No evidence of solid pulmonary nodules or masses. No evidence of pleural effusion. Upper abdomen: No acute findings. Musculoskeletal: No suspicious bone lesions identified. Review of the MIP images confirms the above findings. IMPRESSION: No evidence of pulmonary embolism. Diffuse bilateral ground-glass pulmonary opacity with multifocal areas of airspace opacity, consistent with atypical infectious or inflammatory process. Electronically Signed   By: Danae Orleans M.D.   On: 02/08/2021 22:07   DG Chest Portable 1 View  Result Date: 02/08/2021 CLINICAL DATA:  Shortness of breath. EXAM: PORTABLE CHEST 1 VIEW COMPARISON:  08/23/2019 FINDINGS: Heart size is normal. Lungs are clear. No edema. Remote cervical fusion. IMPRESSION: No evidence for acute  abnormality. Electronically Signed   By: Norva Pavlov M.D.   On: 02/08/2021 17:24    Micro Results  Recent Results (from the past 240 hour(s))  Resp Panel by RT-PCR (Flu A&B, Covid) Nasopharyngeal Swab     Status: None   Collection Time: 02/08/21  5:44 PM   Specimen: Nasopharyngeal  Swab; Nasopharyngeal(NP) swabs in vial transport medium  Result Value Ref Range Status   SARS Coronavirus 2 by RT PCR NEGATIVE NEGATIVE Final    Comment: (NOTE) SARS-CoV-2 target nucleic acids are NOT DETECTED.  The SARS-CoV-2 RNA is generally detectable in upper respiratory specimens during the acute phase of infection. The lowest concentration of SARS-CoV-2 viral copies this assay can detect is 138 copies/mL. A negative result does not preclude SARS-Cov-2 infection and should not be used as the sole basis for treatment or other patient management decisions. A negative result may occur with  improper specimen collection/handling, submission of specimen other than nasopharyngeal swab, presence of viral mutation(s) within the areas targeted by this assay, and inadequate number of viral copies(<138 copies/mL). A negative result must be combined with clinical observations, patient history, and epidemiological information. The expected result is Negative.  Fact Sheet for Patients:  BloggerCourse.com  Fact Sheet for Healthcare Providers:  SeriousBroker.it  This test is no t yet approved or cleared by the [place] FDA and  has been authorized for detection and/or diagnosis of SARS-CoV-2 by FDA under an Emergency Use Authorization (EUA). This EUA will remain  in effect (meaning this test can be used) for the duration of the COVID-19 declaration under Section 564(b)(1) of the Act, 21 U.S.C.section 360bbb-3(b)(1), unless the authorization is terminated  or revoked sooner.       Influenza A by PCR NEGATIVE NEGATIVE Final   Influenza B by PCR NEGATIVE NEGATIVE Final    Comment: (NOTE) The Xpert Xpress SARS-CoV-2/FLU/RSV plus assay is intended as an aid in the diagnosis of influenza from Nasopharyngeal swab specimens and should not be used as a sole basis for treatment. Nasal washings and aspirates are unacceptable for Xpert Xpress  SARS-CoV-2/FLU/RSV testing.  Fact Sheet for Patients: BloggerCourse.com  Fact Sheet for Healthcare Providers: SeriousBroker.it  This test is not yet approved or cleared by the [place] FDA and has been authorized for detection and/or diagnosis of SARS-CoV-2 by FDA under an Emergency Use Authorization (EUA). This EUA will remain in effect (meaning this test can be used) for the duration of the COVID-19 declaration under Section 564(b)(1) of the Act, 21 U.S.C. section 360bbb-3(b)(1), unless the authorization is terminated or revoked.  Performed at [facility], [address] [place], [place] [address]   Culture, blood (routine x 2)     Status: None (Preliminary result)   Collection Time: 02/09/21  4:00 PM   Specimen: BLOOD  Result Value Ref Range Status   Specimen Description BLOOD RIGHT ANTECUBITAL  Final   Special Requests   Final    BOTTLES DRAWN AEROBIC AND ANAEROBIC Blood Culture adequate volume   Culture   Final    NO GROWTH < 24 HOURS Performed at [facility], [address] [place], [place] [address]    Report Status PENDING  Incomplete  Respiratory (~20 pathogens) panel by PCR     Status: None   Collection Time: 02/09/21  4:16 PM   Specimen: Nasopharyngeal Swab; Respiratory  Result Value Ref Range Status   Adenovirus NOT DETECTED NOT DETECTED Final   Coronavirus 229E NOT DETECTED NOT DETECTED Final    Comment: (NOTE) The Coronavirus on the Respiratory Panel, DOES NOT test for the novel  Coronavirus (2019 nCoV)  Coronavirus HKU1 NOT DETECTED NOT DETECTED Final   Coronavirus NL63 NOT DETECTED NOT DETECTED Final   Coronavirus OC43 NOT DETECTED NOT DETECTED Final   Metapneumovirus NOT DETECTED NOT DETECTED Final   Rhinovirus / Enterovirus NOT DETECTED NOT DETECTED Final   Influenza A NOT DETECTED NOT DETECTED Final   Influenza B NOT DETECTED NOT DETECTED Final   Parainfluenza  Virus 1 NOT DETECTED NOT DETECTED Final   Parainfluenza Virus 2 NOT DETECTED NOT DETECTED Final   Parainfluenza Virus 3 NOT DETECTED NOT DETECTED Final   Parainfluenza Virus 4 NOT DETECTED NOT DETECTED Final   Respiratory Syncytial Virus NOT DETECTED NOT DETECTED Final   Bordetella pertussis NOT DETECTED NOT DETECTED Final   Bordetella Parapertussis NOT DETECTED NOT DETECTED Final   Chlamydophila pneumoniae NOT DETECTED NOT DETECTED Final   Mycoplasma pneumoniae NOT DETECTED NOT DETECTED Final    Comment: Performed at [facility], [address] [place], [place] [address]  SARS CORONAVIRUS 2 (TAT 6-24 HRS) Nasopharyngeal Nasopharyngeal Swab     Status: None   Collection Time: 02/10/21  7:39 AM   Specimen: Nasopharyngeal Swab  Result Value Ref Range Status   SARS Coronavirus 2 NEGATIVE NEGATIVE Final    Comment: (NOTE) SARS-CoV-2 target nucleic acids are NOT DETECTED.  The SARS-CoV-2 RNA is generally detectable in upper and lower respiratory specimens during the acute phase of infection. Negative results do not preclude SARS-CoV-2 infection, do not rule out co-infections with other pathogens, and should not be used as the sole basis for treatment or other patient management decisions. Negative results must be combined with clinical observations, patient history, and epidemiological information. The expected result is Negative.  Fact Sheet for Patients: HairSlick.no  Fact Sheet for Healthcare Providers: quierodirigir.com  This test is not yet approved or cleared by the [place] FDA and  has been authorized for detection and/or diagnosis of SARS-CoV-2 by FDA under an Emergency Use Authorization (EUA). This EUA will remain  in effect (meaning this test can be used) for the duration of the COVID-19 declaration under Se ction 564(b)(1) of the Act, 21 U.S.C. section 360bbb-3(b)(1), unless the authorization is terminated  or revoked sooner.  Performed at [facility], [address], [place], [place] [address]     Today   Subjective    Masami Plata today has no headache,no chest abdominal pain,no new weakness tingling or numbness, feels much better wants to go home today.     Objective   Blood pressure (!) 142/70, pulse 83, temperature 99.5 F (37.5 C), temperature source Oral, resp. rate 19, height 6\' 1"  (1.854 m), weight 129.6 kg, SpO2 94 %.   Intake/Output Summary (Last 24 hours) at 02/10/2021 1733 Last data filed at 02/10/2021 1500 Gross per 24 hour  Intake 913.54 ml  Output --  Net 913.54 ml    Exam  Awake Alert, No new F.N deficits, Normal affect [place].AT,PERRAL Supple Neck,No JVD, No cervical lymphadenopathy appriciated.  Symmetrical Chest wall movement, Good air movement bilaterally, CTAB RRR,No Gallops,Rubs or new Murmurs, No Parasternal Heave +ve B.Sounds, Abd Soft, Non tender, No organomegaly appriciated, No rebound -guarding or rigidity. No Cyanosis, Clubbing or edema, No new Rash or bruise   Data Review   CBC w Diff:  Lab Results  Component Value Date   WBC 6.6 02/10/2021   HGB 11.9 (L) 02/10/2021   HGB 14.9 09/03/2019   HCT 36.0 (L) 02/10/2021   HCT 43.9 09/03/2019   PLT 83 (L) 02/10/2021  PLT 86 (LL) 09/03/2019   LYMPHOPCT 13 02/10/2021   MONOPCT 9 02/10/2021   EOSPCT 2 02/10/2021   BASOPCT 1 02/10/2021    CMP:  Lab Results  Component Value Date   NA 138 02/10/2021   NA 142 09/03/2019   K 3.3 (L) 02/10/2021   CL 102 02/10/2021   CO2 27 02/10/2021   BUN <5 (L) 02/10/2021   BUN 6 09/03/2019   CREATININE 1.02 02/10/2021   PROT 6.3 (L) 02/08/2021   ALBUMIN 4.0 02/08/2021   BILITOT 1.0 02/08/2021   ALKPHOS 62 02/08/2021   AST 67 (H) 02/08/2021   ALT 30 02/08/2021  .   Total Time in preparing paper work, data evaluation and todays exam - 35 minutes  Susa Raring M.D on 02/10/2021 at 5:33 PM  [facility]

## 2021-02-10 NOTE — Progress Notes (Signed)
PROGRESS NOTE                                                                                                                                                                                                             Patient Demographics:    Kenneth Mckee, is a 55 y.o. male, DOB - [birth year], PJK:[id number]  Outpatient Primary MD for the patient is Default, Provider, MD    LOS - 1  Admit date - 02/08/2021    Chief Complaint  Patient presents with   Shortness of Breath       Brief Narrative (HPI from H&P)  Kenneth Mckee is a 55 y.o. male with medical history significant of OSA, CAD with NSTEMI in 2021 with PCI to mid LCX, hyperlipidemia, prediabetes, tobacco abuse, thrombocytopenia, NAFLD who presented to ED with 4-day history of shortness of breath with exertion only   Subjective:    Kenneth Mckee today has, No headache, No chest pain, No abdominal pain - No Nausea, No new weakness tingling or numbness, +ve exertional SOB.   Assessment  & Plan :     Exertional SOB in a patient with CAD s/p NSTEMI and PCI to left circumflex in 2021-although his CT scan suggests atypical infection I am not convinced that his presentation is consistent with simple pneumonia, most of his symptoms are exertional in nature and started suddenly after he exerted to fix a generator recently.  Agree with placing him on antibiotics for possible atypical infection however I think it is more important to rule out undergoing occult ischemia, have requested cardiology to evaluate.  He is eager to go home however I think it will be prudent for further cardiac evaluation if the cardiology team agrees.  2.  Obesity BMI of 37 with OSA.  Follow with PCP for weight loss nighttime CPAP.  3.  CAD s/p PCI in the past.  Continue dual antiplatelet therapy, beta-blocker and statin for secondary prevention.  Kindly see #1 above.  4.  Ongoing smoking.  Counseled to  quit.  5.  History of heavy alcohol abuse in the past.  Fatty liver per recent liver ultrasound, mild thrombocytopenia with elevated AST.  Denies any ongoing alcohol use, PCP to monitor.  6.  Dyslipidemia.  On statin.  7.  Mild chronic thrombocytopenia.  PCP to evaluate with remote history of  alcohol abuse, fatty liver on ultrasound and elevated AST.  8.  GERD.  On PPI.      Condition - Fair  Family Communication  : None present  Code Status :  Full  Consults  :  Cards  PUD Prophylaxis : PPI   Procedures  :     CT chest.  Bilateral groundglass opacities      Disposition Plan  :    Status is: Inpatient  Remains inpatient appropriate because:Ongoing active pain requiring inpatient pain management  Dispo: The patient is from: Home              Anticipated d/c is to: Home              Patient currently is not medically stable to d/c.   Difficult to place patient No   DVT Prophylaxis  :    SCDs Start: 02/09/21 1458   Lab Results  Component Value Date   PLT 83 (L) 02/10/2021    Diet :  Diet Order             Diet Carb Modified Fluid consistency: Thin; Room service appropriate? Yes  Diet effective now                    Inpatient Medications  Scheduled Meds:  aspirin  81 mg Oral Daily   atorvastatin  40 mg Oral q1800   cholecalciferol  2,000 Units Oral Daily   clopidogrel  75 mg Oral Q breakfast   metoprolol tartrate  12.5 mg Oral BID   multivitamins with iron  1 tablet Oral Daily   pantoprazole  40 mg Oral Daily   sertraline  100 mg Oral Daily   sodium chloride flush  3 mL Intravenous Q12H   Continuous Infusions:  sodium chloride     azithromycin 500 mg (02/09/21 2209)   cefTRIAXone (ROCEPHIN)  IV 2 g (02/09/21 2133)   PRN Meds:.sodium chloride, busPIRone, butalbital-acetaminophen-caffeine, methocarbamol, nitroGLYCERIN, sodium chloride flush  Antibiotics  :    Anti-infectives (From admission, onward)    Start     Dose/Rate Route  Frequency Ordered Stop   02/09/21 2215  azithromycin (ZITHROMAX) 500 mg in sodium chloride 0.9 % 250 mL IVPB        500 mg 250 mL/hr over 60 Minutes Intravenous Every 24 hours 02/09/21 1500 02/13/21 2214   02/09/21 2215  cefTRIAXone (ROCEPHIN) 2 g in sodium chloride 0.9 % 100 mL IVPB        2 g 200 mL/hr over 30 Minutes Intravenous Every 24 hours 02/09/21 1501 02/13/21 2214   02/09/21 1545  cefTRIAXone (ROCEPHIN) 2 g in sodium chloride 0.9 % 100 mL IVPB  Status:  Discontinued        2 g 200 mL/hr over 30 Minutes Intravenous Every 24 hours 02/09/21 1459 02/09/21 1501   02/09/21 1545  azithromycin (ZITHROMAX) 500 mg in sodium chloride 0.9 % 250 mL IVPB  Status:  Discontinued        500 mg 250 mL/hr over 60 Minutes Intravenous Every 24 hours 02/09/21 1459 02/09/21 1500   02/08/21 2215  cefTRIAXone (ROCEPHIN) 1 g in sodium chloride 0.9 % 100 mL IVPB        1 g 200 mL/hr over 30 Minutes Intravenous  Once 02/08/21 2213 02/08/21 2325   02/08/21 2215  azithromycin (ZITHROMAX) 500 mg in sodium chloride 0.9 % 250 mL IVPB        500 mg 250 mL/hr over 60 Minutes  Intravenous  Once 02/08/21 2213 02/09/21 0052        Time Spent in minutes  30   Susa Raring M.D on 02/10/2021 at 1:57 PM  To page go to [email or web address]   [facility] -  Office  [phone]    See all Orders from today for further details    Objective:   Vitals:   02/09/21 2310 02/10/21 0320 02/10/21 0736 02/10/21 1226  BP: 119/74 135/70 120/70 126/75  Pulse: 78 75 73 79  Resp: 19 (!) 21 15 19   Temp: 98.1 F (36.7 C) 98 F (36.7 C) 98.1 F (36.7 C) 97.7 F (36.5 C)  TempSrc: Axillary Axillary Oral Oral  SpO2: 90% 90% 90% 91%  Weight:      Height:        Wt Readings from Last 3 Encounters:  02/09/21 129.6 kg  02/15/20 133.3 kg  10/02/19 131.5 kg     Intake/Output Summary (Last 24 hours) at 02/10/2021 1357 Last data filed at 02/10/2021 0954 Gross per 24 hour  Intake 563.54 ml  Output --  Net 563.54 ml      Physical Exam  Awake Alert, No new F.N deficits, Normal affect [place].AT,PERRAL Supple Neck,No JVD, No cervical lymphadenopathy appriciated.  Symmetrical Chest wall movement, Good air movement bilaterally, CTAB RRR,No Gallops,Rubs or new Murmurs, No Parasternal Heave +ve B.Sounds, Abd Soft, No tenderness, No organomegaly appriciated, No rebound - guarding or rigidity. No Cyanosis, Clubbing or edema, No new Rash or bruise     Data Review:    CBC Recent Labs  Lab 02/08/21 1744 02/09/21 1557 02/10/21 0056  WBC 5.9 6.0 6.6  HGB 12.1* 12.5* 11.9*  HCT 36.2* 36.7* 36.0*  PLT 89* 84* 83*  MCV 94.8 95.6 96.8  MCH 31.7 32.6 32.0  MCHC 33.4 34.1 33.1  RDW 15.5 15.6* 15.7*  LYMPHSABS 0.9 0.8 0.9  MONOABS 0.4 0.4 0.6  EOSABS 0.1 0.1 0.1  BASOSABS 0.0 0.0 0.0    Recent Labs  Lab 02/08/21 1744 02/08/21 1930 02/09/21 1557 02/10/21 0056 02/10/21 0732  NA 141  --  138 138  --   K 3.4*  --  3.8 3.3*  --   CL 105  --  101 102  --   CO2 28  --  29 27  --   GLUCOSE 92  --  99 96  --   BUN 5*  --  <5* <5*  --   CREATININE 0.92  --  0.97 1.02  --   CALCIUM 9.0  --  8.3* 8.2*  --   AST 67*  --   --   --   --   ALT 30  --   --   --   --   ALKPHOS 62  --   --   --   --   BILITOT 1.0  --   --   --   --   ALBUMIN 4.0  --   --   --   --   MG  --   --  2.1  --   --   CRP  --   --   --   --  2.3*  DDIMER  --  0.28  --   --  0.45  PROCALCITON  --   --  0.10 <0.10  --   HGBA1C  --   --  6.0*  --   --   BNP 96.5  --   --   --  62.8    ------------------------------------------------------------------------------------------------------------------  No results for input(s): CHOL, HDL, LDLCALC, TRIG, CHOLHDL, LDLDIRECT in the last 72 hours.  Lab Results  Component Value Date   HGBA1C 6.0 (H) 02/09/2021   ------------------------------------------------------------------------------------------------------------------ No results for input(s): TSH, T4TOTAL, T3FREE, THYROIDAB in the  last 72 hours.  Invalid input(s): FREET3  Cardiac Enzymes No results for input(s): CKMB, TROPONINI, MYOGLOBIN in the last 168 hours.  Invalid input(s): CK ------------------------------------------------------------------------------------------------------------------    Component Value Date/Time   BNP 62.8 02/10/2021 0732    Radiology Reports CT Angio Chest PE W/Cm &/Or Wo Cm  Result Date: 02/08/2021 CLINICAL DATA:  Shortness of breath for 5 days. Hypoxia. High probability for pulmonary embolism. EXAM: CT ANGIOGRAPHY CHEST WITH CONTRAST TECHNIQUE: Multidetector CT imaging of the chest was performed using the standard protocol during bolus administration of intravenous contrast. Multiplanar CT image reconstructions and MIPs were obtained to evaluate the vascular anatomy. CONTRAST:  OMNIPAQUE IOHEXOL 350 MG/ML SOLN COMPARISON:  None. FINDINGS: Cardiovascular: Satisfactory opacification of pulmonary arteries noted, and no pulmonary emboli identified. No evidence of thoracic aortic dissection or aneurysm. Mediastinum/Nodes: No masses or pathologically enlarged lymph nodes identified. Lungs/Pleura: Diffuse ground-glass pulmonary opacity is seen bilaterally, with multiple ill-defined focal areas of airspace opacity involving the upper lobes and right middle lobe. No evidence of solid pulmonary nodules or masses. No evidence of pleural effusion. Upper abdomen: No acute findings. Musculoskeletal: No suspicious bone lesions identified. Review of the MIP images confirms the above findings. IMPRESSION: No evidence of pulmonary embolism. Diffuse bilateral ground-glass pulmonary opacity with multifocal areas of airspace opacity, consistent with atypical infectious or inflammatory process. Electronically Signed   By: Danae Orleans M.D.   On: 02/08/2021 22:07   DG Chest Portable 1 View  Result Date: 02/08/2021 CLINICAL DATA:  Shortness of breath. EXAM: PORTABLE CHEST 1 VIEW COMPARISON:  08/23/2019  FINDINGS: Heart size is normal. Lungs are clear. No edema. Remote cervical fusion. IMPRESSION: No evidence for acute  abnormality. Electronically Signed   By: Norva Pavlov M.D.   On: 02/08/2021 17:24
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
## 2021-02-10 NOTE — Evaluation (Signed)
hysical Therapy Evaluation Patient Details Name: Kenneth Mckee MRN: [id number] DOB: October 14, 1965 Today's Date: 02/10/2021  History of Present Illness  Kenneth Mckee is a 55 y.o. male with medical history significant of OSA, CAD with NSTEMI in 2021 with PCI to mid LCX, hyperlipidemia, prediabetes, tobacco abuse, thrombocytopenia, NAFLD who presented to ED with 4-day history of shortness of breath with exertion only. He denies any chest pain, palpitations, coughing, chest tightness, leg swelling. He denies any fever/chills. He does have a history of smoking 1PPD x 30 years. He has been around his son who had a respiratory virus x 2 weeks   Clinical Impression  Patient received in bed, he is agreeable to PT assessment. Reports he has been up in room. No concerns. Wants to go home. Patient is independent with bed mobility, transfers and ambulation in hall. Asymptomatic with ambulation. O2 saturations down to 88% but quickly back up into 90%s with rest. Patient appears to be at baseline level of function. No follow up needs or equipment identified.       Recommendations for follow up therapy are one component of a multi-disciplinary discharge planning process, led by the attending physician.  Recommendations may be updated based on patient status, additional functional criteria and insurance authorization.  Follow Up Recommendations No PT follow up    Equipment Recommendations  None recommended by PT    Recommendations for Other Services       Precautions / Restrictions Precautions Precautions: None Restrictions Weight Bearing Restrictions: No      Mobility  Bed Mobility Overal bed mobility: Independent                  Transfers Overall transfer level: Independent                  Ambulation/Gait Ambulation/Gait assistance: Supervision Gait Distance (Feet): 125 Feet Assistive device: None Gait Pattern/deviations: WFL(Within Functional Limits);Wide base of  support Gait velocity: decreased   General Gait Details: patient generally steady, no reported sob or chest pain with ambulation. O2 saturation down to 88% on room air, quickly returning to 90%s with rest.  Stairs            Wheelchair Mobility    Modified Rankin (Stroke Patients Only)       Balance Overall balance assessment: Independent                                           Pertinent Vitals/Pain Pain Assessment: No/denies pain    Home Living Family/patient expects to be discharged to:: Private residence Living Arrangements: Spouse/significant other;Children Available Help at Discharge: Family;Available 24 hours/day Type of Home: House Home Access: Level entry     Home Layout: One level Home Equipment: None      Prior Function Level of Independence: Independent               Hand Dominance        Extremity/Trunk Assessment   Upper Extremity Assessment Upper Extremity Assessment: Overall WFL for tasks assessed    Lower Extremity Assessment Lower Extremity Assessment: Overall WFL for tasks assessed    Cervical / Trunk Assessment Cervical / Trunk Assessment: Normal  Communication   Communication: No difficulties  Cognition Arousal/Alertness: Awake/alert Behavior During Therapy: WFL for tasks assessed/performed Overall Cognitive Status: Within Functional Limits for tasks assessed  General Comments      Exercises     Assessment/Plan    PT Assessment Patent does not need any further PT services  PT Problem List         PT Treatment Interventions      PT Goals (Current goals can be found in the Care Plan section)  Acute Rehab PT Goals Patient Stated Goal: to go home today PT Goal Formulation: With patient Time For Goal Achievement: 02/11/21 Potential to Achieve Goals: Good    Frequency     Barriers to discharge        Co-evaluation                AM-PAC PT "6 Clicks" Mobility  Outcome Measure Help needed turning from your back to your side while in a flat bed without using bedrails?: None Help needed moving from lying on your back to sitting on the side of a flat bed without using bedrails?: None Help needed moving to and from a bed to a chair (including a wheelchair)?: None Help needed standing up from a chair using your arms (e.g., wheelchair or bedside chair)?: None Help needed to walk in hospital room?: None Help needed climbing 3-5 steps with a railing? : None 6 Click Score: 24    End of Session   Activity Tolerance: Patient tolerated treatment well Patient left: in bed;with call bell/phone within reach Nurse Communication: Mobility status      Time: 9892-1194 PT Time Calculation (min) (ACUTE ONLY): 13 min   Charges:   PT Evaluation $PT Eval Low Complexity: 1 Low          Barbaraann Avans, PT, GCS 02/10/21,2:59 PM

## 2021-02-10 NOTE — TOC Initial Note (Addendum)
Transition of Care [facility]) - Initial/Assessment Note    Patient Details  Name: Kenneth Mckee MRN: [id number] Date of Birth: [birth year]  Transition of Care [facility]) CM/SW Contact:    Kenneth Sabal, RN Phone Number: 02/10/2021, 12:22 PM  Clinical Narrative:        Kenneth Mckee w patient at bedside. He states that he lives at home w his wife and two kids, still drives, utilized [employer] for medications.  He describes himself as independent, occasionally using a cane when his spinal stenosis is bothering him, but usually does not need to. We discussed discharge needs and resources. He declines HH services. Informed him I will be following him for home oxygen as he had a qualifying sat yesterday.  Encouraged him to sit up on edge of bed, and instructed him on how to use IS, he was blowing into it instead if inspiring.  Cm will continue to follow.       For home oxygen set up if needed: O2 Coordinator Kenneth Mckee [phone] ext [phone], pager [phone] Home Oxygen order needs called in to [facility] and DME company before 2pm to be processed the same day.  [place] contracts with [facility] in [place] for all home oxygen [phone]       Expected Discharge Plan: Home/Self Care Barriers to Discharge: Continued Medical Work up   Patient Goals and CMS Choice Patient states their goals for this hospitalization and ongoing recovery are:: to return home      Expected Discharge Plan and Services Expected Discharge Plan: Home/Self Care   Discharge Planning Services: CM Consult Post Acute Care Choice: NA Living arrangements for the past 2 months: Single Family Home                                      Prior Living Arrangements/Services Living arrangements for the past 2 months: Single Family Home Lives with:: Spouse, Adult Children                   Activities of Daily Living Home Assistive Devices/Equipment: None ADL Screening (condition at time of  admission) Patient's cognitive ability adequate to safely complete daily activities?: Yes Is the patient deaf or have difficulty hearing?: No Does the patient have difficulty seeing, even when wearing glasses/contacts?: No Does the patient have difficulty concentrating, remembering, or making decisions?: No Patient able to express need for assistance with ADLs?: Yes Does the patient have difficulty dressing or bathing?: No Independently performs ADLs?: Yes (appropriate for developmental age) Does the patient have difficulty walking or climbing stairs?: No Weakness of Legs: None Weakness of Arms/Hands: None  Permission Sought/Granted                  Emotional Assessment              Admission diagnosis:  CAP (community acquired pneumonia) [J18.9] Community acquired pneumonia, unspecified laterality [J18.9] Patient Active Problem List   Diagnosis Date Noted   CAP (community acquired pneumonia) 02/08/2021   CAD (coronary artery disease) 08/23/2019   Thrombocytopenia (HCC) 08/23/2019   Tobacco abuse 08/23/2019   HLD (hyperlipidemia) 08/23/2019   Spinal stenosis in cervical region 02/13/2013   Hernia, epigastric 09/15/2011   Arthritis    OBESITY 06/23/2010   PREDIABETES 05/29/2010   ALCOHOL ABUSE, IN REMISSION 05/22/2010   ARTHRITIS 05/22/2010   PLANTAR FASCIITIS 05/22/2010   OSA treated with BiPAP 05/22/2010  EPIGASTRIC PAIN 05/22/2010   CHICKENPOX, HX OF 05/22/2010   PCP:  Default, Provider, MD Pharmacy:   [facility] - [place], [place] - [address] [place] [address] Phone: [phone] Fax: [phone]     Social Determinants of Health (SDOH) Interventions    Readmission Risk Interventions No flowsheet data found.

## 2021-02-10 NOTE — Progress Notes (Signed)
Discharge paperwork reviewed with pt. Pt verbalized understanding. Pt made aware of the prescription in his discharge paperwork and needing to be filled immediately so he can continue on abx therapy.   Pt has taken all of his belongings with him. Pt alert and oriented x4 in no acute distress upon discharge. Pt's wife awaiting downstairs at main entrance to transport pt home via private vehicle. Pt transported downstairs via wheelchair by RN.

## 2021-02-10 NOTE — Discharge Instructions (Signed)
Follow with Primary MD and your Cardiologist in 7 days   Get CBC, CMP, 2 view Chest X ray -  checked next visit within 1 week by Primary MD   Activity: As tolerated with Full fall precautions use walker/cane & assistance as needed  Disposition Home   Diet: Heart Healthy Low Carb  Special Instructions: If you have smoked or chewed Tobacco  in the last 2 yrs please stop smoking, stop any regular Alcohol  and or any Recreational drug use.  On your next visit with your primary care physician please Get Medicines reviewed and adjusted.  Please request your Prim.MD to go over all Hospital Tests and Procedure/Radiological results at the follow up, please get all Hospital records sent to your Prim MD by signing hospital release before you go home.  If you experience worsening of your admission symptoms, develop shortness of breath, life threatening emergency, suicidal or homicidal thoughts you must seek medical attention immediately by calling 911 or calling your MD immediately  if symptoms less severe.  You Must read complete instructions/literature along with all the possible adverse reactions/side effects for all the Medicines you take and that have been prescribed to you. Take any new Medicines after you have completely understood and accpet all the possible adverse reactions/side effects.

## 2021-02-10 NOTE — Care Management (Addendum)
LVM w April Alexander at the [facility] reporting admission and requesting PCP and SW info for this patient for DC planning. Awaiting callback.  Spoke w April, she provides the following info. PCP is Nat Christen CSW [facility] [place] [phone] ext [phone]   O2 Coordinator Joellyn Quails [phone] ext [phone], pager [phone] Home Oxygen order needs called in to [place] and DME company before 2pm to be processed the same day.  [place] contracts with [facility] in [place] for all home oxygen [phone]

## 2021-02-10 NOTE — Progress Notes (Addendum)
SATURATION QUALIFICATIONS: (This note is used to comply with regulatory documentation for home oxygen)  Patient Saturations on Room Air at Rest = 97%  Patient Saturations on Room Air while Ambulating = 90%  Patient Saturations on NA Liters of oxygen while Ambulating = NA  Please briefly explain why patient needs home oxygen: Pt desatted to 90% briefly while ambulating on room air then increased up to 91-92% while ambulating on room air. Did not have to place pt on oxygen.

## 2021-02-13 ENCOUNTER — Other Ambulatory Visit: Payer: Self-pay

## 2021-02-13 ENCOUNTER — Ambulatory Visit (INDEPENDENT_AMBULATORY_CARE_PROVIDER_SITE_OTHER): Payer: No Typology Code available for payment source | Admitting: Family

## 2021-02-13 ENCOUNTER — Encounter (HOSPITAL_BASED_OUTPATIENT_CLINIC_OR_DEPARTMENT_OTHER): Payer: Self-pay | Admitting: Family

## 2021-02-13 VITALS — BP 116/68 | Ht 73.0 in | Wt 304.6 lb

## 2021-02-13 DIAGNOSIS — E785 Hyperlipidemia, unspecified: Secondary | ICD-10-CM | POA: Diagnosis not present

## 2021-02-13 DIAGNOSIS — I10 Essential (primary) hypertension: Secondary | ICD-10-CM

## 2021-02-13 DIAGNOSIS — I25118 Atherosclerotic heart disease of native coronary artery with other forms of angina pectoris: Secondary | ICD-10-CM | POA: Diagnosis not present

## 2021-02-13 DIAGNOSIS — Z720 Tobacco use: Secondary | ICD-10-CM | POA: Diagnosis not present

## 2021-02-13 LAB — COMPREHENSIVE METABOLIC PANEL
ALT: 30 IU/L (ref 0–44)
AST: 34 IU/L (ref 0–40)
Albumin/Globulin Ratio: 2.1 (ref 1.2–2.2)
Albumin: 4.4 g/dL (ref 3.8–4.9)
Alkaline Phosphatase: 81 IU/L (ref 44–121)
BUN/Creatinine Ratio: 6 — ABNORMAL LOW (ref 9–20)
BUN: 6 mg/dL (ref 6–24)
Bilirubin Total: 1 mg/dL (ref 0.0–1.2)
CO2: 24 mmol/L (ref 20–29)
Calcium: 8.7 mg/dL (ref 8.7–10.2)
Chloride: 101 mmol/L (ref 96–106)
Creatinine, Ser: 1.05 mg/dL (ref 0.76–1.27)
Globulin, Total: 2.1 g/dL (ref 1.5–4.5)
Glucose: 102 mg/dL — ABNORMAL HIGH (ref 70–99)
Potassium: 3.8 mmol/L (ref 3.5–5.2)
Sodium: 142 mmol/L (ref 134–144)
Total Protein: 6.5 g/dL (ref 6.0–8.5)
eGFR: 84 mL/min/{1.73_m2} (ref >59)

## 2021-02-13 LAB — LIPID PANEL
Chol/HDL Ratio: 4.4 ratio (ref 0.0–5.0)
Cholesterol, Total: 79 mg/dL — ABNORMAL LOW (ref 100–199)
HDL: 18 mg/dL — ABNORMAL LOW (ref >39)
LDL Chol Calc (NIH): 38 mg/dL (ref 0–99)
Triglycerides: 127 mg/dL (ref 0–149)
VLDL Cholesterol Cal: 23 mg/dL (ref 5–40)

## 2021-02-13 LAB — CBC
Hematocrit: 40.2 % (ref 37.5–51.0)
Hemoglobin: 13.2 g/dL (ref 13.0–17.7)
MCH: 31.1 pg (ref 26.6–33.0)
MCHC: 32.8 g/dL (ref 31.5–35.7)
MCV: 95 fL (ref 79–97)
Platelets: 110 10*3/uL — ABNORMAL LOW (ref 150–450)
RBC: 4.25 x10E6/uL (ref 4.14–5.80)
RDW: 14.8 % (ref 11.6–15.4)
WBC: 5.7 10*3/uL (ref 3.4–10.8)

## 2021-02-13 MED ORDER — BUPROPION HCL ER (SR) 150 MG PO TB12: ORAL_TABLET | ORAL | 0 refills | Status: DC

## 2021-02-13 NOTE — Progress Notes (Signed)
Office Visit    Patient Name: Kenneth Mckee Date of Encounter: 02/13/2021  PCP:  Clinic, [facility]  Cardiologist:  Reatha Harps, MD  Advanced Practice Provider:  No care team member to display Electrophysiologist:  None    Chief Complaint    Kenneth Mckee is a 55 y.o. male with a hx of CAD s/p PCI/DES to Lcx in 08/2019, HTN, HLD, pre-DM type 2, thrombocytopenia, OSA on BiPAP, and tobacco abuse  presents today for hospital follow up   Past Medical History    Past Medical History:  Diagnosis Date   Arm vein blood clot    right arm   Arthritis    Chickenpox    Coronary artery disease    GERD (gastroesophageal reflux disease)    NAFLD (nonalcoholic fatty liver disease)    NSTEMI (non-ST elevated myocardial infarction) (HCC) 08/23/2019   Phlebitis    right wrist artery- so small. Artery so far down  no surgery indicated. Was on coumadin for 6 months in 2004   PTSD (post-traumatic stress disorder)    Sleep apnea 2004-2006   sleep study (severe)' wears BiPap nightly   Umbilical hernia    Past Surgical History:  Procedure Laterality Date   ANTERIOR CERVICAL DECOMP/DISCECTOMY FUSION N/A 02/13/2013   Procedure: ANTERIOR CERVICAL DECOMPRESSION/DISCECTOMY FUSION Cervical Three-Four, Four-Five, Five-Six ;  Surgeon: Temple Pacini, MD;  Location: [facility] NEURO ORS;  Service: Neurosurgery;  Laterality: N/A;  ANTERIOR CERVICAL DECOMPRESSION/DISCECTOMY FUSION 3 LEVELS   CERVICAL DISCECTOMY  2005   CHOLECYSTECTOMY N/A 09/03/2012   Procedure: LAPAROSCOPIC CHOLECYSTECTOMY WITH INTRAOPERATIVE CHOLANGIOGRAM;  Surgeon: Liz Malady, MD;  Location: [facility] OR;  Service: General;  Laterality: N/A;   HERNIA REPAIR  2013   epigastric hernia repair w/mesh   LEFT HEART CATH AND CORONARY ANGIOGRAPHY N/A 08/24/2019   Procedure: LEFT HEART CATH AND CORONARY ANGIOGRAPHY;  Surgeon: Iran Ouch, MD;  Location: [facility] INVASIVE CV LAB;  Service: Cardiovascular;   Laterality: N/A;   NASAL SINUS SURGERY     TONSILLECTOMY     UPPER GI ENDOSCOPY      Allergies  Allergies  Allergen Reactions   Maxalt [Rizatriptan] Other (See Comments)    unknown   Penicillins Other (See Comments)    From childhood   Sumatriptan Other (See Comments)    Dizziness (intolerance)    History of Present Illness    Kenneth Mckee is a 55 y.o. male with a hx of CAD s/p PCI/DES to Lcx in 08/2019, HTN, HLD, pre-DM type 2, thrombocytopenia, OSA on BiPAP, and tobacco abuse  last seen while hospitalized.  NSTEMI 08/2019 with 40% P RCA stenosis, 50% RCA stenosis, 99% and LCx stenosis managed with PCI/DES and 99% D1 stenosis recommended for medical therapy and consideration of staged intervention for for angina.  Echo at that time LVEF 55%, no R WMA, normal LV diastolic function, no significant valvular dysfunction.  He was seen last in clinic 02/15/2020 doing well although joint pain limiting activity.  He was recommended to continue DAPT with aspirin and Plavix for 1 year follow-up April 2022 to determine readiness to de-escalate antiplatelet therapy.  He was admitted 02/08/2021 due to dyspnea.  O2 saturation dropped to 88% with ambulation.  Labs notable for high-sensitivity troponin negative x2, BNP normal, CRP 2.3, Pro-Cal less than 0.1.  Chest x-ray without acute findings.  CTA chest with diffuse bilateral groundglass pulmonary opacities with multifocal areas of airspace opacity consistent  with atypical infectious or inflammatory process.  He was started on antibiotics.  When seen in consult by cardiology during admission reported improvement in his breathing.  Presents today for follow up with his wife.  Tells me he quit smoking 02/06/2021 and is very motivated to quit.  He has been smoking since he was 54 years old.  Reports no chest pain, trouble tightness.  Tells me he still feels congested and is of the antibiotic he received may not be enough as he only has 1 more day of therapy.   We discussed that he was recommended for chest x-ray 1 week after discharge and to discuss with primary care provider.  Reports dyspnea is overall stable compared to hospital discharge.  Denies edema, orthopnea, PND.  Had edema in his lower extremities a few weeks ago but this self resolved and has not recurred.  EKGs/Labs/Other Studies Reviewed:   The following studies were reviewed today:  Echocardiogram 08/2019: 1. Left ventricular ejection fraction, by estimation, is 55%. The left  ventricle has normal function. The left ventricle has no regional wall  motion abnormalities. Left ventricular diastolic parameters were normal.   2. Right ventricular systolic function is normal. The right ventricular  size is normal. There is normal pulmonary artery systolic pressure.   3. The mitral valve is normal in structure. Trivial mitral valve  regurgitation. No evidence of mitral stenosis.   4. The aortic valve is tricuspid. Aortic valve regurgitation is trivial.  Mild aortic valve sclerosis is present, with no evidence of aortic valve  stenosis.   5. The inferior vena cava is normal in size with greater than 50%  respiratory variability, suggesting right atrial pressure of 3 mmHg.    LHC 08/2019: Prox RCA lesion is 40% stenosed. Mid RCA lesion is 50% stenosed. Mid Cx lesion is 99% stenosed. Post intervention, there is a 0% residual stenosis. A drug-eluting stent was successfully placed using a STENT RESOLUTE ONYX 2.5X15. 1st Diag lesion is 99% stenosed.   1.  Significant two-vessel coronary artery disease involving mid left circumflex and first diagonal. 2.  Mildly elevated left ventricular end-diastolic pressure at 22 mmHg.  Left ventricular angiography was not performed. 3.  Successful angioplasty and drug-eluting stent placement to the mid left circumflex.   Recommendations: Left radial approach was chosen given the patient's reported history of right hand ischemia in the remote past with  no palpable ulnar pulse on the right. The plan was to perform PCI of the diagonal also.  However, the patient had severe heartburn and uncontrollable cough after he was given 600 mg of clopidogrel.  He could not hold still for the rest of the procedure and when we were finally able to sedate him, he became hypoxic from oversedation. The diagonal appears to be 2 mm in diameter.  Continue medical therapy for now.  If there is any recurrent anginal symptoms, diagonal PCI can be staged for Monday after we ensure optimal control of his GERD.  I increased his Protonix to twice daily.   Diagnostic Dominance: Right Intervention     EKG: No EKG today  Recent Labs: 02/08/2021: ALT 30 02/09/2021: Magnesium 2.1 02/10/2021: B Natriuretic Peptide 62.8; BUN <5; Creatinine, Ser 1.02; Hemoglobin 11.9; Platelets 83; Potassium 3.3; Sodium 138  Recent Lipid Panel    Component Value Date/Time   CHOL 128 08/23/2019 2215   TRIG 113 08/23/2019 2215   HDL 22 (L) 08/23/2019 2215   CHOLHDL 5.8 08/23/2019 2215   VLDL 23 08/23/2019 2215  LDLCALC 83 08/23/2019 2215   Home Medications   Current Meds  Medication Sig   aspirin 81 MG chewable tablet Chew 1 tablet (81 mg total) by mouth daily.   atorvastatin (LIPITOR) 40 MG tablet Take 1 tablet (40 mg total) by mouth daily at 6 PM.   azithromycin (ZITHROMAX) 500 MG tablet Take 1 tablet (500 mg total) by mouth daily.   buPROPion (WELLBUTRIN SR) 150 MG 12 hr tablet Take one tablet daily for 3 days then take one tablet twice daily for 3 months.   busPIRone (BUSPAR) 15 MG tablet Take 7.5 mg by mouth daily as needed (anxiety).   butalbital-acetaminophen-caffeine (FIORICET) 50-325-40 MG tablet Take 2 tablets by mouth 2 (two) times daily as needed for migraine.   chlorhexidine (HIBICLENS) 4 % external liquid Apply 1 application topically daily as needed (body wash).   Cholecalciferol 50 MCG (2000 UT) TABS Take 2,000 Units by mouth daily.   clindamycin (CLEOCIN T) 1 %  external solution Apply 1 application topically daily as needed (skin irritation).   clobetasol ointment (TEMOVATE) 0.05 % Apply 1 application topically daily as needed (skin irritation).   cyclobenzaprine (FLEXERIL) 10 MG tablet Take 10 mg by mouth daily as needed for muscle spasms.   Meclizine HCl 25 MG CHEW Chew 25 mg by mouth daily as needed for dizziness.   methocarbamol (ROBAXIN) 500 MG tablet Take 500 mg by mouth daily as needed for muscle spasms.   metoprolol tartrate (LOPRESSOR) 25 MG tablet Take 0.5 tablets (12.5 mg total) by mouth 2 (two) times daily.   Multiple Vitamins-Iron TABS Take 1 tablet by mouth daily.   nitroGLYCERIN (NITROSTAT) 0.4 MG SL tablet Place 1 tablet (0.4 mg total) under the tongue every 5 (five) minutes x 3 doses as needed for chest pain.   pantoprazole (PROTONIX) 40 MG tablet Take 40 mg by mouth daily.   promethazine (PHENERGAN) 25 MG tablet Take 25 mg by mouth daily as needed for nausea or vomiting.   sertraline (ZOLOFT) 100 MG tablet Take 100 mg by mouth daily.   [DISCONTINUED] clopidogrel (PLAVIX) 75 MG tablet Take 1 tablet (75 mg total) by mouth daily with breakfast.   [DISCONTINUED] varenicline (CHANTIX STARTING MONTH PAK) 0.5 MG X 11 & 1 MG X 42 tablet Take one 0.5 mg tablet by mouth once daily for 3 days, then increase to one 0.5 mg tablet twice daily for 4 days, then increase to one 1 mg tablet twice daily.     Review of Systems      All other systems reviewed and are otherwise negative except as noted above.  Physical Exam    VS:  BP 116/68   Ht 6\' 1"  (1.854 m)   Wt (!) 304 lb 9.6 oz (138.2 kg)   SpO2 91%   BMI 40.19 kg/m  , BMI Body mass index is 40.19 kg/m.  Wt Readings from Last 3 Encounters:  02/13/21 (!) 304 lb 9.6 oz (138.2 kg)  02/09/21 285 lb 11.5 oz (129.6 kg)  02/15/20 293 lb 12.8 oz (133.3 kg)    GEN: Well nourished, overweight, well developed, in no acute distress. HEENT: normal. Neck: Supple, no JVD, carotid bruits, or  masses. Cardiac: RRR, no murmurs, rubs, or gallops. No clubbing, cyanosis, edema.  Radials/PT 2+ and equal bilaterally.  Respiratory:  Respirations regular and unlabored, clear to auscultation bilaterally. GI: Soft, nontender, nondistended. MS: No deformity or atrophy. Skin: Warm and dry, no rash. Neuro:  Strength and sensation are intact. Psych: Normal affect.  Assessment & Plan    DOE- Recent admission with immunity acquired pneumonia still completing course of antibiotics.  He was recommended for chest x-ray 1 week from discharge and encouraged to discuss with PCP. No diarrhea from abx. Has echocardiogram upcoming to rule out cardiac etiology for dyspnea. CBC, CMP today for monitoring of hemoglobin, anemia, electrolytes.   CAD s/p PCI/DES to circumflex 08/2019- Stable with no anginal symptoms. No indication for ischemic evaluation.  GDMT includes aspirin, metoprolol, atorvastatin. As he is >1 year from DES will discontinue clopidogrel. Heart healthy diet and regular cardiovascular exercise encouraged.    HLD, LDL goal less than 70-continue atorvastatin 40 mg daily.  Update lipid panel today.  If LDL not at goal consider increasing his atorvastatin versus addition of Zetia.  Lipid-lowering diet encouraged.  HTN- BP well controlled. Continue current antihypertensive regimen including metoprolol 12.5 mg twice daily.  Obesity - Weight loss via diet and exercise encouraged. Discussed the impact being overweight would have on cardiovascular risk, HTN, dyspnea, CAD.  OSA- CPAP compliance encouraged.   Tobacco use- Last smoked 02/06/21. Motivated to quit. To assist in cessation, will Rx Wellbutrin 150mg  BID x 3 months   Disposition: Follow up  in November as scheduled  with Dr. December   Signed, Flora Lipps, NP 02/13/2021, 12:18 PM [facility]

## 2021-02-13 NOTE — Patient Instructions (Signed)
Medication Instructions:  Your physician has recommended you make the following change in your medication:   STOP Clopidogrel (Plavix)  *If you need a refill on your cardiac medications before your next appointment, please call your pharmacy*   Lab Work: Your physician recommends that you return for lab work today: CBC, lipid panel, CMP  If you have labs (blood work) drawn today and your tests are completely normal, you will receive your results only by: MyChart Message (if you have MyChart) OR A paper copy in the mail If you have any lab test that is abnormal or we need to change your treatment, we will call you to review the results.  Testing/Procedures: You have an echocardiogram upcoming. This will let us look at your heart pumping function, heart relaxing function, and heart valves.   Follow-Up: At [facility], you and your health needs are our priority.  As part of our continuing mission to provide you with exceptional heart care, we have created designated Provider Care Teams.  These Care Teams include your primary Cardiologist (physician) and Advanced Practice Providers (APPs -  Physician Assistants and Nurse Practitioners) who all work together to provide you with the care you need, when you need it.  We recommend signing up for the patient portal called "MyChart".  Sign up information is provided on this After Visit Summary.  MyChart is used to connect with patients for Virtual Visits (Telemedicine).  Patients are able to view lab/test results, encounter notes, upcoming appointments, etc.  Non-urgent messages can be sent to your provider as well.   To learn more about what you can do with MyChart, go to [email or web address].    Your next appointment:   2-3 month(s)  The format for your next appointment:   In Person  Provider:   You may see Reatha Harps, MD or one of the following Advanced Practice Providers on your designated Care Team:   Azalee Course, PA-C Micah Flesher, [place] or  Judy Pimple, [place]   Other Instructions  Managing the Challenge of Quitting Smoking Quitting smoking is a physical and mental challenge. You will face cravings, withdrawal symptoms, and temptation. Before quitting, work with your health care provider to make a plan that can help you manage quitting. Preparation can help you quit and keep you from giving in. How to manage lifestyle changes Managing stress Stress can make you want to smoke, and wanting to smoke may cause stress. It is important to find ways to manage your stress. You might try some of the following: Practice relaxation techniques. Breathe slowly and deeply, in through your nose and out through your mouth. Listen to music. Soak in a bath or take a shower. Imagine a peaceful place or vacation. Get some support. Talk with family or friends about your stress. Join a support group. Talk with a counselor or therapist. Get some physical activity. Go for a walk, run, or bike ride. Play a favorite sport. Practice yoga.  Medicines Talk with your health care provider about medicines that might help you deal with cravings and make quitting easier for you. Relationships Social situations can be difficult when you are quitting smoking. To manage this, you can: Avoid parties and other social situations where people might be smoking. Avoid alcohol. Leave right away if you have the urge to smoke. Explain to your family and friends that you are quitting smoking. Ask for support and let them know you might be a bit grumpy. Plan activities where smoking is not  an option. General instructions Be aware that many people gain weight after they quit smoking. However, not everyone does. To keep from gaining weight, have a plan in place before you quit and stick to the plan after you quit. Your plan should include: Having healthy snacks. When you have a craving, it may help to: Eat popcorn, carrots, celery, or other cut  vegetables. Chew sugar-free gum. Changing how you eat. Eat small portion sizes at meals. Eat 4-6 small meals throughout the day instead of 1-2 large meals a day. Be mindful when you eat. Do not watch television or do other things that might distract you as you eat. Exercising regularly. Make time to exercise each day. If you do not have time for a long workout, do short bouts of exercise for 5-10 minutes several times a day. Do some form of strengthening exercise, such as weight lifting. Do some exercise that gets your heart beating and causes you to breathe deeply, such as walking fast, running, swimming, or biking. This is very important. Drinking plenty of water or other low-calorie or no-calorie drinks. Drink 6-8 glasses of water daily.  How to recognize withdrawal symptoms Your body and mind may experience discomfort as you try to get used to not having nicotine in your system. These effects are called withdrawal symptoms. They may include: Feeling hungrier than normal. Having trouble concentrating. Feeling irritable or restless. Having trouble sleeping. Feeling depressed. Craving a cigarette. To manage withdrawal symptoms: Avoid places, people, and activities that trigger your cravings. Remember why you want to quit. Get plenty of sleep. Avoid coffee and other caffeinated drinks. These may worsen some of your symptoms. These symptoms may surprise you. But be assured that they are normal to have when quitting smoking. How to manage cravings Come up with a plan for how to deal with your cravings. The plan should include the following: A definition of the specific situation you want to deal with. An alternative action you will take. A clear idea for how this action will help. The name of someone who might help you with this. Cravings usually last for 5-10 minutes. Consider taking the following actions to help you with your plan to deal with cravings: Keep your mouth busy. Chew  sugar-free gum. Suck on hard candies or a straw. Brush your teeth. Keep your hands and body busy. Change to a different activity right away. Squeeze or play with a ball. Do an activity or a hobby, such as making bead jewelry, practicing needlepoint, or working with wood. Mix up your normal routine. Take a short exercise break. Go for a quick walk or run up and down stairs. Focus on doing something kind or helpful for someone else. Call a friend or family member to talk during a craving. Join a support group. Contact a quitline. Where to find support To get help or find a support group: Call the National Cancer Institute's Smoking Quitline: 1-800-QUIT NOW (434)076-8092) Visit the website of the Substance Abuse and Mental Health Services Administration: SkateOasis.com.pt Text QUIT to SmokefreeTXT: 419622 Where to find more information Visit these websites to find more information on quitting smoking: National Cancer Institute: www.smokefree.gov American Lung Association: www.lung.org American Cancer Society: www.cancer.org Centers for Disease Control and Prevention: FootballExhibition.com.br American Heart Association: www.heart.org Contact a health care provider if: You want to change your plan for quitting. The medicines you are taking are not helping. Your eating feels out of control or you cannot sleep. Get help right away if: You feel  depressed or become very anxious. Summary Quitting smoking is a physical and mental challenge. You will face cravings, withdrawal symptoms, and temptation to smoke again. Preparation can help you as you go through these challenges. Try different techniques to manage stress, handle social situations, and prevent weight gain. You can deal with cravings by keeping your mouth busy (such as by chewing gum), keeping your hands and body busy, calling family or friends, or contacting a quitline for people who want to quit smoking. You can deal with withdrawal symptoms by  avoiding places where people smoke, getting plenty of rest, and avoiding drinks with caffeine. This information is not intended to replace advice given to you by your health care provider. Make sure you discuss any questions you have with your health care provider. Document Revised: 02/13/2019 Document Reviewed: 02/13/2019 Elsevier Patient Education  2022 [employer].

## 2021-02-14 LAB — CULTURE, BLOOD (ROUTINE X 2)
Culture: NO GROWTH
Special Requests: ADEQUATE

## 2021-02-16 ENCOUNTER — Telehealth: Payer: Self-pay | Admitting: *Deleted

## 2021-02-16 DIAGNOSIS — E785 Hyperlipidemia, unspecified: Secondary | ICD-10-CM

## 2021-02-16 MED ORDER — ATORVASTATIN CALCIUM 40 MG PO TABS: 20.0000 mg | ORAL_TABLET | Freq: Every day | ORAL | 6 refills | Status: AC

## 2021-02-16 NOTE — Telephone Encounter (Signed)
The patient has been notified of the result and verbalized understanding.  All questions (if any) were answered. DECREASE ATROVASTATIN 20 MG ( 1/2 TABLET DAILY)  CHANGED MEDICATION LIST.  MAILED LABSLIP ( LIPID)FOR 2 MONTH RECHECK  Tobin Chad, RN 02/16/2021 10:45 AM

## 2021-02-16 NOTE — Telephone Encounter (Signed)
-----   Message from Alver Sorrow, NP sent at 02/14/2021  8:48 AM EDT ----- Normal kidneys, liver, electrolytes. Lipid panel shows cholesterol at goal. If he wishes, could reduce Atorvastatin to 20mg  daily with repeat lipid panel in 2 months. CBC with no evidence of anemia nor infection.  Platelet counts low but improving compared to previous. Overall good result.

## 2021-02-17 ENCOUNTER — Ambulatory Visit (INDEPENDENT_AMBULATORY_CARE_PROVIDER_SITE_OTHER): Payer: No Typology Code available for payment source

## 2021-02-17 ENCOUNTER — Other Ambulatory Visit: Payer: Self-pay

## 2021-02-17 DIAGNOSIS — I2510 Atherosclerotic heart disease of native coronary artery without angina pectoris: Secondary | ICD-10-CM | POA: Diagnosis not present

## 2021-02-17 DIAGNOSIS — R0609 Other forms of dyspnea: Secondary | ICD-10-CM

## 2021-02-17 LAB — ECHOCARDIOGRAM COMPLETE
AR max vel: 3.28 cm2
AV Area VTI: 3.48 cm2
AV Area mean vel: 3.42 cm2
AV Mean grad: 4 mmHg
AV Peak grad: 9.6 mmHg
Ao pk vel: 1.55 m/s
Area-P 1/2: 2.93 cm2
S' Lateral: 4.1 cm

## 2021-02-19 ENCOUNTER — Telehealth (HOSPITAL_BASED_OUTPATIENT_CLINIC_OR_DEPARTMENT_OTHER): Payer: Self-pay | Admitting: Family

## 2021-02-19 DIAGNOSIS — Z720 Tobacco use: Secondary | ICD-10-CM

## 2021-02-19 MED ORDER — BUPROPION HCL ER (SR) 150 MG PO TB12: ORAL_TABLET | ORAL | 0 refills | Status: AC

## 2021-02-19 NOTE — Telephone Encounter (Signed)
Rx(s) sent to pharmacy electronically.  

## 2021-02-19 NOTE — Telephone Encounter (Signed)
*  STAT* If patient is at the pharmacy, call can be transferred to refill team.   1. Which medications need to be refilled? (please list name of each medication and dose if known)  buPROPion (WELLBUTRIN SR) 150 MG 12 hr tablet  2. Which pharmacy/location (including street and city if local pharmacy) is medication to be sent to? [facility] - [place], [place] - [address]  3. Do they need a 30 day or 90 day supply? 90  with refills    Patient said he is having a hard time getting this medication from the [place] and he would rather just get this from his local pharmacy to pay for it out of pocket

## 2021-03-20 NOTE — Progress Notes (Deleted)
Cardiology Office Note:   Date:  03/20/2021  NAME:  Kenneth Mckee    MRN: [id number] DOB:  Mar 10, 1966   PCP:  [facility]  Cardiologist:  Reatha Harps, MD  Electrophysiologist:  None   Referring MD: No ref. provider found   No chief complaint on file. ***  History of Present Illness:   Kenneth Mckee is a 55 y.o. male with a hx of CAD status post PCI to the mid circumflex, 99% diagonal lesion managed medically, fatty liver, thrombocytopenia, GERD who presents for follow-up.  Seen in the emergency room in early October for shortness of breath.  Troponins were negative.  Medical management was recommended.  Problem List 1. NSTEMI 08/23/2019 -PCI to mid LCX -99% D1 lesion but small vessel and declined staged PCI 2. GERD 3. Fatty liver 4. Thrombocytopenia 5. Obesity/OSA 6. Tobacco abuse   Past Medical History: Past Medical History:  Diagnosis Date   Arm vein blood clot    right arm   Arthritis    Chickenpox    Coronary artery disease    GERD (gastroesophageal reflux disease)    NAFLD (nonalcoholic fatty liver disease)    NSTEMI (non-ST elevated myocardial infarction) (HCC) 08/23/2019   Phlebitis    right wrist artery- so small. Artery so far down  no surgery indicated. Was on coumadin for 6 months in 2004   PTSD (post-traumatic stress disorder)    Sleep apnea 2004-2006   sleep study (severe)' wears BiPap nightly   Umbilical hernia     Past Surgical History: Past Surgical History:  Procedure Laterality Date   ANTERIOR CERVICAL DECOMP/DISCECTOMY FUSION N/A 02/13/2013   Procedure: ANTERIOR CERVICAL DECOMPRESSION/DISCECTOMY FUSION Cervical Three-Four, Four-Five, Five-Six ;  Surgeon: Temple Pacini, MD;  Location: [facility] NEURO ORS;  Service: Neurosurgery;  Laterality: N/A;  ANTERIOR CERVICAL DECOMPRESSION/DISCECTOMY FUSION 3 LEVELS   CERVICAL DISCECTOMY  2005   CHOLECYSTECTOMY N/A 09/03/2012   Procedure: LAPAROSCOPIC CHOLECYSTECTOMY WITH INTRAOPERATIVE CHOLANGIOGRAM;   Surgeon: Liz Malady, MD;  Location: [facility] OR;  Service: General;  Laterality: N/A;   HERNIA REPAIR  2013   epigastric hernia repair w/mesh   LEFT HEART CATH AND CORONARY ANGIOGRAPHY N/A 08/24/2019   Procedure: LEFT HEART CATH AND CORONARY ANGIOGRAPHY;  Surgeon: Iran Ouch, MD;  Location: [facility] INVASIVE CV LAB;  Service: Cardiovascular;  Laterality: N/A;   NASAL SINUS SURGERY     TONSILLECTOMY     UPPER GI ENDOSCOPY      Current Medications: No outpatient medications have been marked as taking for the 03/23/21 encounter (Appointment) with O'Neal, Ronnald Ramp, MD.     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  ROS:   All other ROS reviewed and negative. Pertinent positives noted in the HPI.     EKGs/Labs/Other Studies Reviewed:   The  following studies were personally reviewed by me today:  EKG:  EKG is *** ordered today.  The ekg ordered today demonstrates ***, and was personally reviewed by me.   TTE 02/17/2021  1. Left ventricular ejection fraction, by estimation, is 50 to 55%. The  left ventricle has low normal function. The left ventricle has no regional  wall motion abnormalities. The left ventricular internal cavity size was  mildly dilated. Left ventricular  diastolic parameters are consistent with Grade I diastolic dysfunction  (impaired relaxation). Elevated left atrial pressure.   2. Right ventricular systolic function is normal. The right ventricular  size is normal.   3. The mitral valve is normal in structure. No evidence of mitral valve  regurgitation. No evidence of mitral stenosis.   4. The aortic valve has an indeterminant number of cusps. Aortic valve  regurgitation is not visualized. No aortic stenosis is present.   5. The inferior vena cava is normal in size with greater than 50%  respiratory variability, suggesting right atrial pressure of 3 mmHg.   LHC 08/24/2019 1.  Significant two-vessel coronary artery disease involving mid left circumflex and first diagonal. 2.  Mildly elevated left ventricular end-diastolic pressure at 22 mmHg.  Left ventricular angiography was not performed. 3.  Successful angioplasty and drug-eluting stent placement to the mid left circumflex.  Recent Labs: 02/09/2021: Magnesium 2.1 02/10/2021: B Natriuretic Peptide 62.8 02/13/2021: ALT 30; BUN 6; Creatinine, Ser 1.05; Hemoglobin 13.2; Platelets 110; Potassium 3.8; Sodium 142   Recent Lipid Panel    Component Value Date/Time   CHOL 79 (L) 02/13/2021 0845   TRIG 127 02/13/2021 0845   HDL 18 (L) 02/13/2021 0845   CHOLHDL 4.4 02/13/2021 0845   CHOLHDL 5.8 08/23/2019 2215   VLDL 23 08/23/2019 2215   LDLCALC 38 02/13/2021 0845    Physical Exam:   VS:  There were no vitals taken for this visit.   Wt Readings from  Last 3 Encounters:  02/13/21 (!) 304 lb 9.6 oz (138.2 kg)  02/09/21 285 lb 11.5 oz (129.6 kg)  02/15/20 293 lb 12.8 oz (133.3 kg)    General: Well nourished, well developed, in no acute distress Head: Atraumatic, normal size  Eyes: PEERLA, EOMI  Neck: Supple, no JVD Endocrine: No thryomegaly Cardiac: Normal S1, S2; RRR; no murmurs, rubs, or gallops Lungs: Clear to auscultation bilaterally, no wheezing, rhonchi or rales  Abd: Soft, nontender, no hepatomegaly  Ext: No edema, pulses 2+ Musculoskeletal: No deformities, BUE and BLE strength normal and equal Skin: Warm and dry, no rashes   Neuro: Alert and oriented to person, place, time, and situation, CNII-XII grossly intact, no focal deficits  Psych: Normal mood and affect   ASSESSMENT:   Kenneth Mckee is a 55 y.o. male who presents for the following: No diagnosis found.  PLAN:   There are no diagnoses linked to this encounter.  {Are you ordering a CV Procedure (e.g. stress test, cath, DCCV, TEE, etc)?   Press F2        :[id number]}  Disposition: No follow-ups on file.  Medication Adjustments/Labs and Tests Ordered: Current medicines are reviewed at length with the patient today.  Concerns regarding medicines are outlined above.  No orders of the defined types were placed in this encounter.  No orders of the defined types were placed in this encounter.   There are no Patient Instructions on file for this visit.   Time Spent with Patient: I have spent a total of *** minutes with patient reviewing hospital notes, telemetry, EKGs, labs and examining the patient as well as establishing an assessment and plan that was discussed with the patient.  > 50% of time was spent in direct patient care.  Signed, Lenna Gilford. Flora Lipps, MD, [facility]  [facility]  [address] [place], [place] [address] [phone]  03/20/2021 2:48 PM

## 2021-03-23 ENCOUNTER — Ambulatory Visit: Payer: No Typology Code available for payment source | Admitting: Cardiovascular Disease

## 2021-03-23 DIAGNOSIS — I25118 Atherosclerotic heart disease of native coronary artery with other forms of angina pectoris: Secondary | ICD-10-CM

## 2021-03-23 DIAGNOSIS — E785 Hyperlipidemia, unspecified: Secondary | ICD-10-CM

## 2021-03-23 DIAGNOSIS — I10 Essential (primary) hypertension: Secondary | ICD-10-CM

## 2021-09-15 ENCOUNTER — Other Ambulatory Visit (HOSPITAL_COMMUNITY): Payer: Self-pay | Admitting: Specialist

## 2021-09-15 ENCOUNTER — Other Ambulatory Visit: Payer: Self-pay | Admitting: Specialist

## 2021-09-15 DIAGNOSIS — G9389 Other specified disorders of brain: Secondary | ICD-10-CM

## 2021-09-28 ENCOUNTER — Telehealth: Payer: Self-pay

## 2021-09-28 NOTE — Telephone Encounter (Signed)
LMVM for patient to contact his provider about having H&P forms faxed back to our office as these are needed for his MR that has been scheduled.

## 2021-10-15 ENCOUNTER — Encounter (HOSPITAL_COMMUNITY): Payer: Self-pay

## 2021-10-15 ENCOUNTER — Ambulatory Visit (HOSPITAL_COMMUNITY)
Admission: AD | Admit: 2021-10-15 | Discharge: 2021-10-15 | Disposition: A | Discharge status: Home / Self Care | Payer: No Typology Code available for payment source | Source: Ambulatory Visit | Attending: Specialist | Admitting: Specialist

## 2021-10-15 ENCOUNTER — Encounter (HOSPITAL_COMMUNITY): Admission: AD | Disposition: A | Discharge status: Home / Self Care | Payer: Self-pay | Source: Ambulatory Visit

## 2021-10-15 ENCOUNTER — Encounter (HOSPITAL_COMMUNITY): Payer: Self-pay | Admitting: Anesthesiology

## 2021-10-15 ENCOUNTER — Ambulatory Visit (HOSPITAL_COMMUNITY)
Admission: RE | Admit: 2021-10-15 | Discharge: 2021-10-15 | Disposition: A | Discharge status: Home / Self Care | Payer: No Typology Code available for payment source | Source: Ambulatory Visit | Attending: Specialist | Admitting: Specialist

## 2021-10-15 DIAGNOSIS — G9389 Other specified disorders of brain: Secondary | ICD-10-CM

## 2021-10-15 DIAGNOSIS — R930 Abnormal findings on diagnostic imaging of skull and head, not elsewhere classified: Secondary | ICD-10-CM | POA: Insufficient documentation

## 2021-10-15 DIAGNOSIS — Z5329 Procedure and treatment not carried out because of patient's decision for other reasons: Secondary | ICD-10-CM | POA: Insufficient documentation

## 2021-10-15 SURGERY — MRI WITH ANESTHESIA
Anesthesia: General

## 2021-10-15 MED ORDER — LACTATED RINGERS IV SOLN: INTRAVENOUS | Status: DC

## 2021-10-15 MED ORDER — CHLORHEXIDINE GLUCONATE 0.12 % MT SOLN
OROMUCOSAL | Status: AC
  Administered 2021-10-15: 15 mL via OROMUCOSAL
  Filled 2021-10-15: qty 15

## 2021-10-15 MED ORDER — CHLORHEXIDINE GLUCONATE 0.12 % MT SOLN: 15.0000 mL | Freq: Once | OROMUCOSAL | Status: AC

## 2021-10-15 NOTE — Progress Notes (Signed)
Nurse at bedside reviewing pre-op questions.   Patient questioned how long MRI would take and asked about light sedation.  Nurse explained to patient that he would need to speak with anesthesiologist for the anesthesia plan but typically it is done with general anesthesia.   Patient became upset and stated that he was told it would just be with light sedation through his IV and that he was not going to have general anesthesia.   Nurse reached out to Dr. Hyacinth Meeker to inform him of situation and requested he come to speak with the patient.   Dr. Hyacinth Meeker came to bedside and discussed the anesthesia plan with the patient.   At this time, the patient decided that he did not want to proceed forward with the MRI today.  Patient was very upset because he felt like we were unprepared and did not have plan for him.  Patient had arrived to hospital at 0600 for an 0800 MRI with anesthesia but patient was not scheduled with anesthesia.   Pre-op was notified at 0730 that patient was in lobby.  After confirming with MRI and anesthesia, patient was immediately brought back to short stay.    At time of discharge, patient was upset but very apologetic to nurse.  Nurse apologized to patient and patient was very receptive.

## 2021-10-28 ENCOUNTER — Encounter (HOSPITAL_COMMUNITY): Payer: Self-pay | Admitting: Emergency Medicine

## 2021-10-28 ENCOUNTER — Encounter (HOSPITAL_COMMUNITY): Payer: Self-pay | Admitting: *Deleted

## 2021-10-28 NOTE — Progress Notes (Signed)
Patient is cancelling this MRI procedure.   PCP - [facility] Cardiologist - Gillian Shields, NP  CT Chest x-ray - 02/08/21 EKG - 02/08/21 Stress Test - n/a ECHO - 02/17/21 Cardiac Cath - 08/24/19  ICD Pacemaker/Loop - n/a  Sleep Study -  Yes BiPAP - uses nightly one.

## 2021-10-28 NOTE — Anesthesia Preprocedure Evaluation (Deleted)
Anesthesia Evaluation    Airway        Dental   Pulmonary Current Smoker,           Cardiovascular      Neuro/Psych    GI/Hepatic   Endo/Other    Renal/GU      Musculoskeletal   Abdominal   Peds  Hematology   Anesthesia Other Findings   Reproductive/Obstetrics                             Anesthesia Physical Anesthesia Plan  ASA:   Anesthesia Plan:    Post-op Pain Management:    Induction:   PONV Risk Score and Plan:   Airway Management Planned:   Additional Equipment:   Intra-op Plan:   Post-operative Plan:   Informed Consent:   Plan Discussed with:   Anesthesia Plan Comments: (See APP note by Joslyn Hy, FNP )        Anesthesia Quick Evaluation

## 2021-10-28 NOTE — Progress Notes (Signed)
Anesthesia Chart Review:  Pt is a same day work up   Case: [id number] Date/Time: 10/29/21 0745   Procedure: MRI WITH ANESTHESIA - MRI OF BRAIN   Anesthesia type: General   Pre-op diagnosis: TINNITUS   [facility]: [facility] RAD[facility] / [facility]   Surgeons: Radiologist, Medication, MD       DISCUSSION: Pt is 56 years old with hx CAD (DES to CX 08/24/19), OSA, NAFLD  Hospitalized 10/2-10/4/23 for SOB. EKG and troponin negative. Thought to be infectious process, d/c on antibiotics.   PROVIDERS: - Primary care at [facility] - Cardiologist is Lennie Odor, MD. Last office visit 02/13/21 with Gillian Shields, NP   LABS: Will be obtained day of surgery as needed   IMAGES: CT angio chest 02/08/21:  - No evidence of pulmonary embolism. - Diffuse bilateral ground-glass pulmonary opacity with multifocal areas of airspace opacity, consistent with atypical infectious or inflammatory process   EKG 02/08/21: SR. RBBB   CV: Echo 02/17/21:  1. Left ventricular ejection fraction, by estimation, is 50 to 55%. The left ventricle has low normal function. The left ventricle has no regional wall motion abnormalities. The left ventricular internal cavity size was mildly dilated. Left ventricular diastolic parameters are consistent with Grade I diastolic dysfunction (impaired relaxation). Elevated left atrial pressure.  2. Right ventricular systolic function is normal. The right ventricular size is normal.  3. The mitral valve is normal in structure. No evidence of mitral valve regurgitation. No evidence of mitral stenosis.  4. The aortic valve has an indeterminant number of cusps. Aortic valve regurgitation is not visualized. No aortic stenosis is present.  5. The inferior vena cava is normal in size with greater than 50% respiratory variability, suggesting right atrial pressure of 3 mmHg.    Cardiac cath 08/24/19:  Prox RCA lesion is 40% stenosed. Mid RCA lesion is 50% stenosed. Mid Cx lesion is 99%  stenosed. Post intervention, there is a 0% residual stenosis. A drug-eluting stent was successfully placed using a STENT RESOLUTE ONYX 2.5X15. 1st Diag lesion is 99% stenosed. 1.  Significant two-vessel coronary artery disease involving mid left CX and first diagonal. 2.  Mildly elevated left ventricular end-diastolic pressure at 22 mmHg.  Left ventricular angiography was not performed. 3.  Successful angioplasty and drug-eluting stent placement to the mid left circumflex.   Past Medical History:  Diagnosis Date   Arm vein blood clot    right arm   Arthritis    Chickenpox    Complication of anesthesia    Coronary artery disease    GERD (gastroesophageal reflux disease)    NAFLD (nonalcoholic fatty liver disease)    NSTEMI (non-ST elevated myocardial infarction) (HCC) 08/23/2019   Phlebitis    right wrist artery- so small. Artery so far down  no surgery indicated. Was on coumadin for 6 months in 2004   PTSD (post-traumatic stress disorder)    Sleep apnea 2004-2006   sleep study (severe)' wears BiPap nightly   Umbilical hernia     Past Surgical History:  Procedure Laterality Date   ANTERIOR CERVICAL DECOMP/DISCECTOMY FUSION N/A 02/13/2013   Procedure: ANTERIOR CERVICAL DECOMPRESSION/DISCECTOMY FUSION Cervical Three-Four, Four-Five, Five-Six ;  Surgeon: Temple Pacini, MD;  Location: [facility]UR[facility];  Service: Neurosurgery;  Laterality: N/A;  ANTERIOR CERVICAL DECOMPRESSION/DISCECTOMY FUSION 3 LEVELS   CERVICAL DISCECTOMY  2005   CHOLECYSTECTOMY N/A 09/03/2012   Procedure: LAPAROSCOPIC CHOLECYSTECTOMY WITH INTRAOPERATIVE CHOLANGIOGRAM;  Surgeon: Liz Malady, MD;  Location: [facility] OR;  Service:  General;  Laterality: N/A;   HERNIA REPAIR  2013   epigastric hernia repair w/mesh   LEFT HEART CATH AND CORONARY ANGIOGRAPHY N/A 08/24/2019   Procedure: LEFT HEART CATH AND CORONARY ANGIOGRAPHY;  Surgeon: Iran Ouch, MD;  Location: [facility] INVASIVE CV LAB;  Service: Cardiovascular;  Laterality:  N/A;   NASAL SINUS SURGERY     TONSILLECTOMY     UPPER GI ENDOSCOPY      MEDICATIONS: No current facility-administered medications for this encounter.    aspirin 81 MG chewable tablet   atorvastatin (LIPITOR) 40 MG tablet   azithromycin (ZITHROMAX) 500 MG tablet   buPROPion (WELLBUTRIN SR) 150 MG 12 hr tablet   busPIRone (BUSPAR) 15 MG tablet   butalbital-acetaminophen-caffeine (FIORICET) 50-325-40 MG tablet   chlorhexidine (HIBICLENS) 4 % external liquid   Cholecalciferol 50 MCG (2000 UT) TABS   clindamycin (CLEOCIN T) 1 % external solution   clobetasol ointment (TEMOVATE) 0.05 %   cyclobenzaprine (FLEXERIL) 10 MG tablet   Meclizine HCl 25 MG CHEW   methocarbamol (ROBAXIN) 500 MG tablet   metoprolol tartrate (LOPRESSOR) 25 MG tablet   Multiple Vitamins-Iron TABS   nitroGLYCERIN (NITROSTAT) 0.4 MG SL tablet   pantoprazole (PROTONIX) 40 MG tablet   promethazine (PHENERGAN) 25 MG tablet   sertraline (ZOLOFT) 100 MG tablet    If labs acceptable day of surgery, I anticipate pt can proceed with surgery as scheduled.  Rica Mast, PhD, FNP-BC [facility]/Anesthesiology Phone: [phone] 10/28/2021 10:16 AM

## 2021-10-29 ENCOUNTER — Ambulatory Visit: Admit: 2021-10-29 | Payer: No Typology Code available for payment source

## 2021-10-29 ENCOUNTER — Ambulatory Visit (HOSPITAL_COMMUNITY): Admission: RE | Admit: 2021-10-29 | Payer: No Typology Code available for payment source | Source: Ambulatory Visit

## 2021-10-29 HISTORY — DX: Other complications of anesthesia, initial encounter: T88.59XA

## 2021-10-29 SURGERY — MRI WITH ANESTHESIA
Anesthesia: General

## 2021-12-27 IMAGING — DX DG CHEST 1V PORT
1 series · 1 of 1 positions shown · non-contrast
Comparison: 08/23/2019

CLINICAL DATA: Shortness of breath.

EXAM:
PORTABLE CHEST 1 VIEW

[chest]
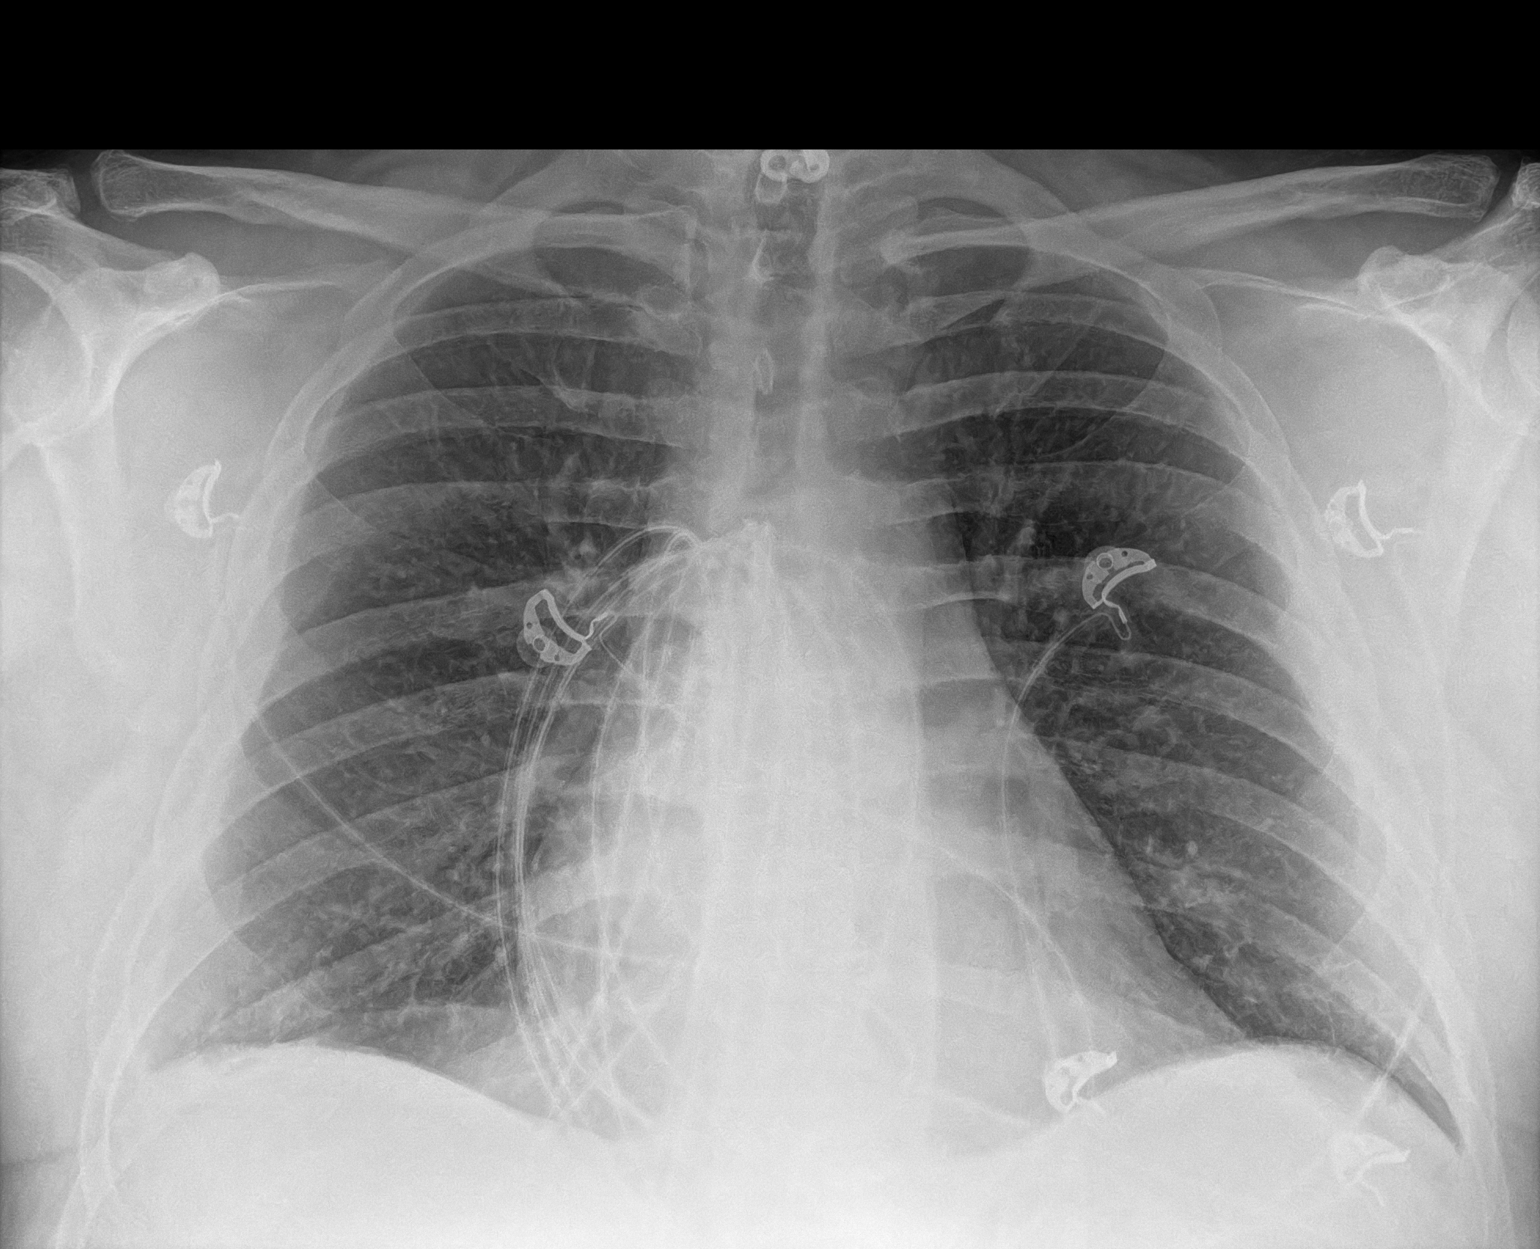

[1 of 1 positions shown; findings below may reference images not displayed]

FINDINGS: Heart size is normal. Lungs are clear. No edema. Remote cervical
fusion.
IMPRESSION: No evidence for acute  abnormality.

## 2021-12-27 IMAGING — CT CT ANGIO CHEST
2 of 7 series · 17 of 46 positions shown · IV contrast (omnipaque)
Comparison: None.

CLINICAL DATA: Shortness of breath for 5 days. Hypoxia. High
probability for pulmonary embolism.

EXAM:
CT ANGIOGRAPHY CHEST WITH CONTRAST
TECHNIQUE: Multidetector CT imaging of the chest was performed using the
standard protocol during bolus administration of intravenous
contrast. Multiplanar CT image reconstructions and MIPs were
obtained to evaluate the vascular anatomy.
CONTRAST:  100mL OMNIPAQUE IOHEXOL 350 MG/ML SOLN

[Series 5: pe axial thins · axial · 0.83mm/px · z∈[-437,-164]mm · 14 of 317 slices shown]
[im 22/317  lung]
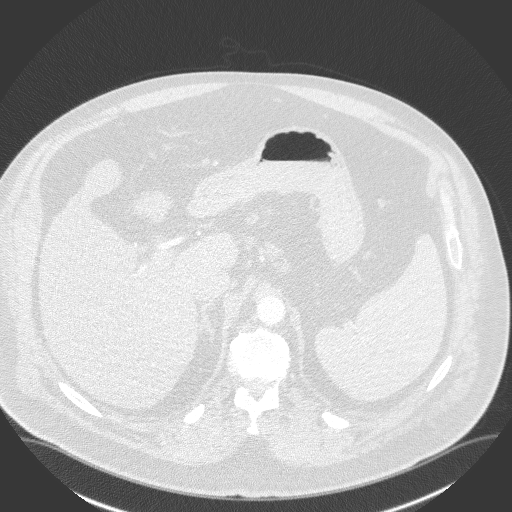
[im 43/317  soft-tissue]
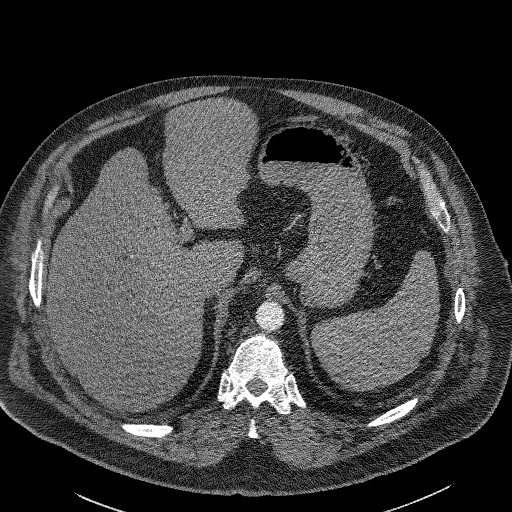
[im 64/317  lung]
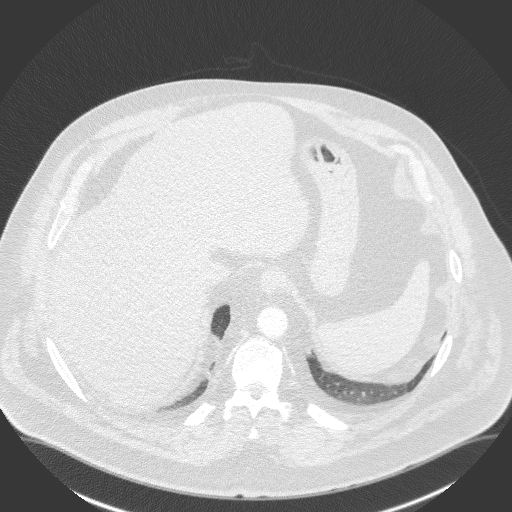
[im 85/317  soft-tissue]
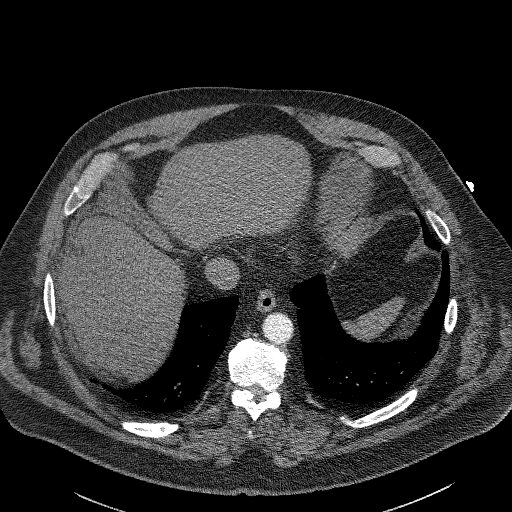
[im 106/317  lung]
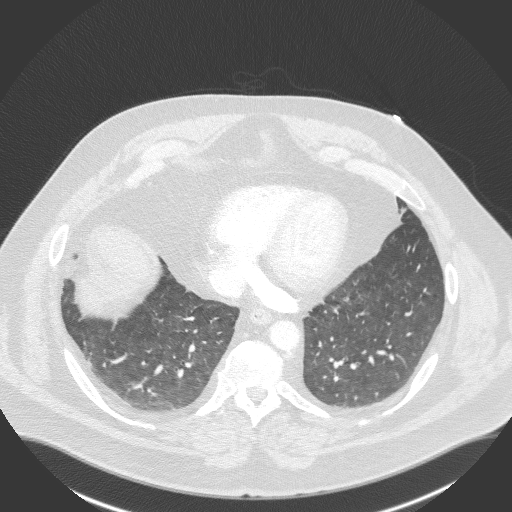
[im 127/317  soft-tissue]
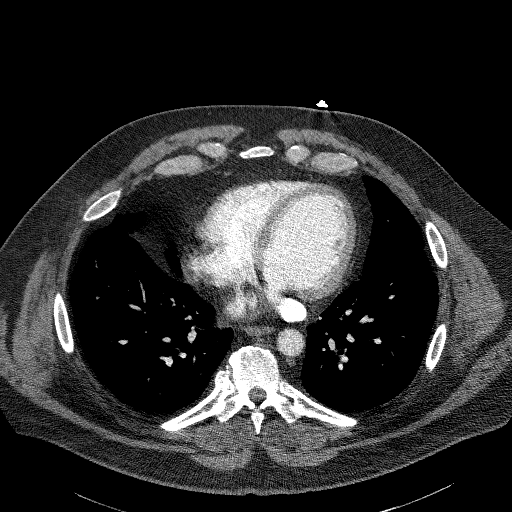
[im 148/317  lung]
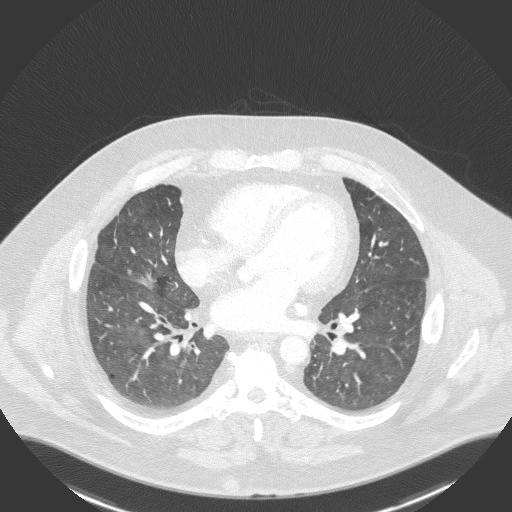
[im 169/317  soft-tissue]
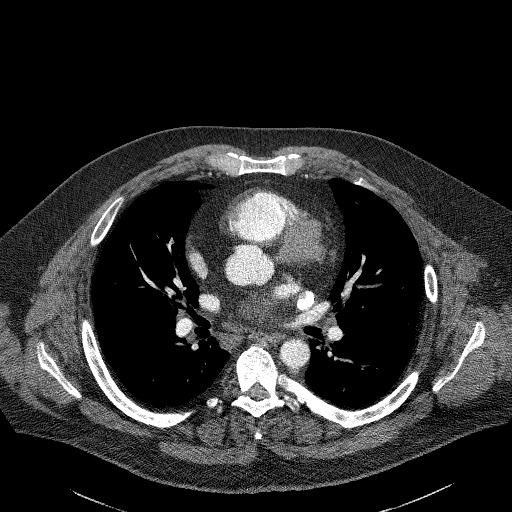
[im 190/317  lung]
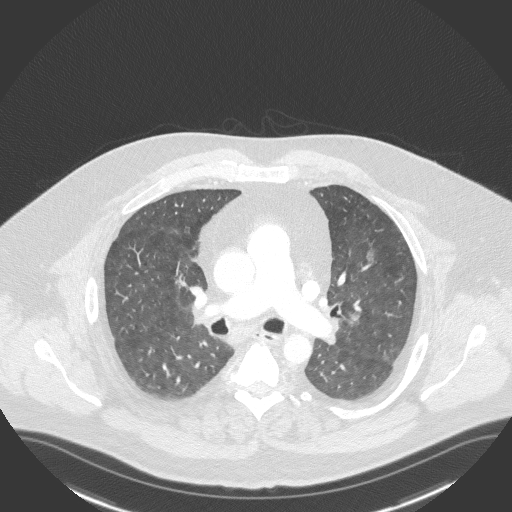
[im 211/317  soft-tissue]
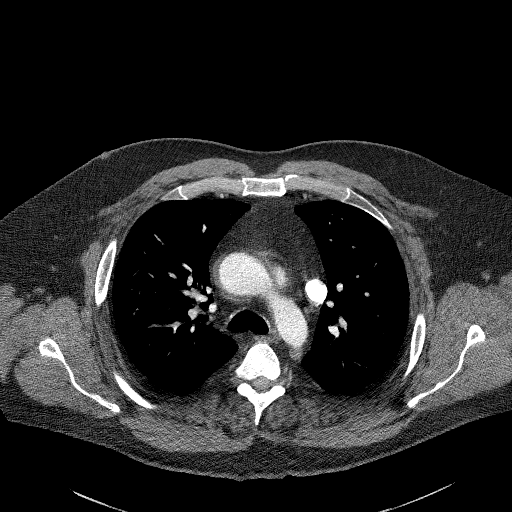
[im 232/317  lung]
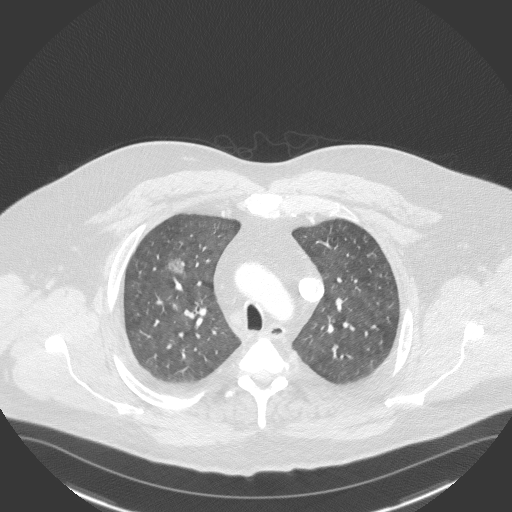
[im 253/317  soft-tissue]
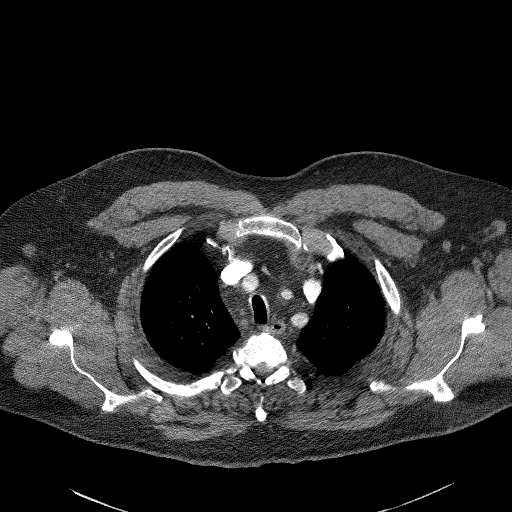
[im 274/317  lung]
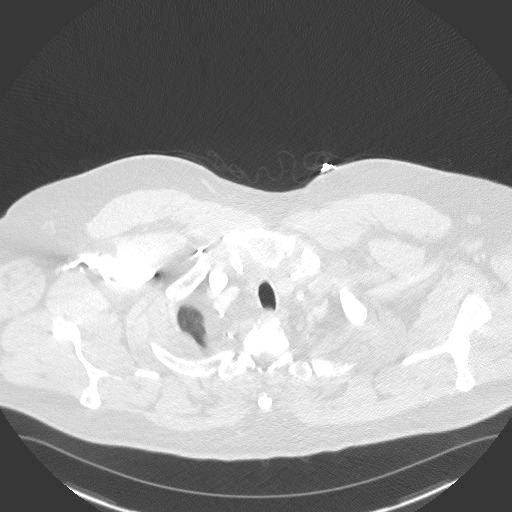
[im 295/317  soft-tissue]
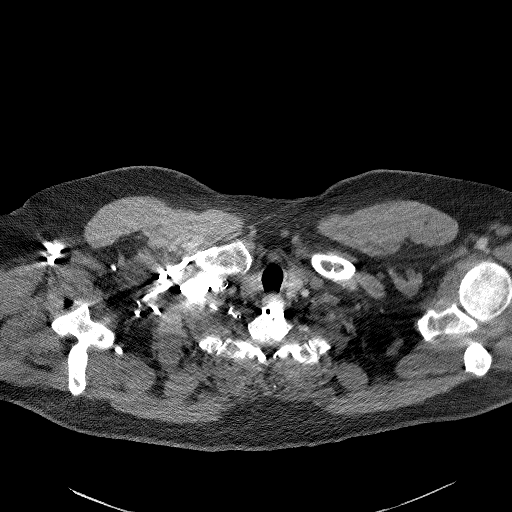

[Series 7: cor soft · coronal · 0.64mm/px · 3 of 176 slices shown]
[im 44/176  soft-tissue]
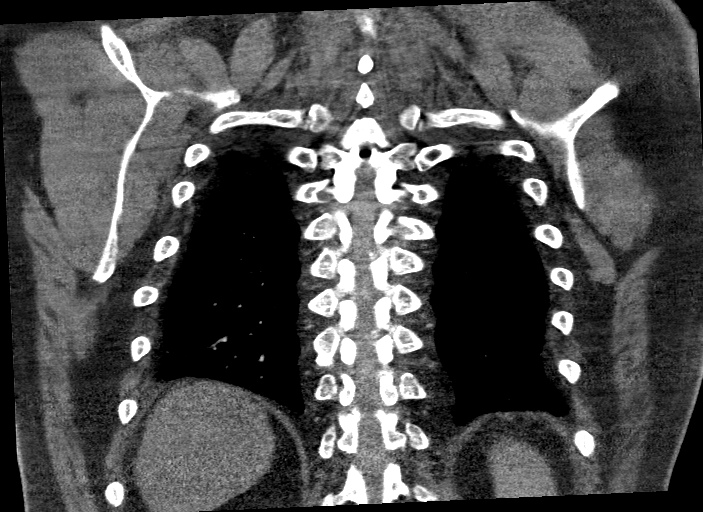
[im 88/176  soft-tissue]
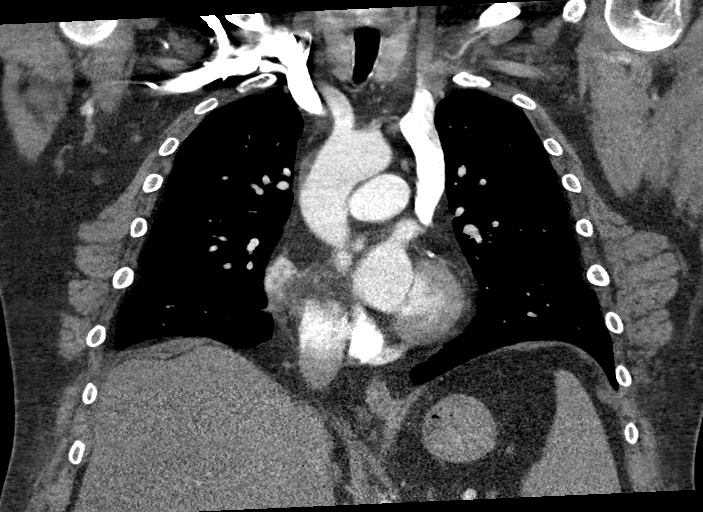
[im 132/176  soft-tissue]
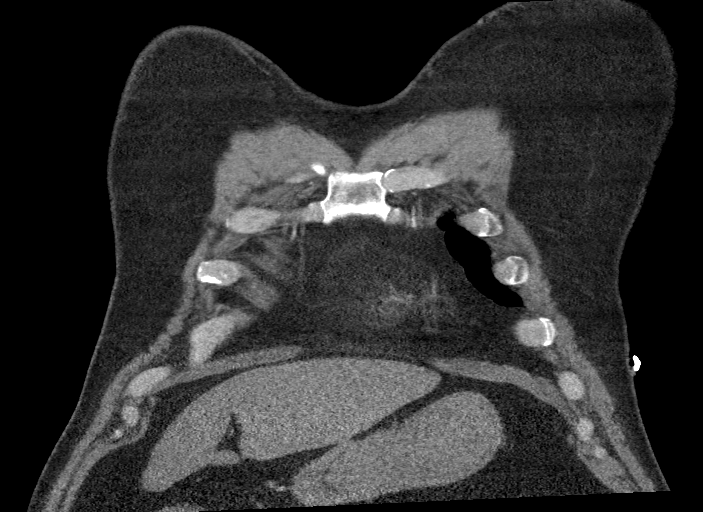

[17 of 46 positions shown; findings below may reference images not displayed]

FINDINGS: Cardiovascular: Satisfactory opacification of pulmonary arteries
noted, and no pulmonary emboli identified. No evidence of thoracic
aortic dissection or aneurysm.

Mediastinum/Nodes: No masses or pathologically enlarged lymph nodes
identified.

Lungs/Pleura: Diffuse ground-glass pulmonary opacity is seen
bilaterally, with multiple ill-defined focal areas of airspace
opacity involving the upper lobes and right middle lobe. No evidence
of solid pulmonary nodules or masses. No evidence of pleural
effusion.

Upper abdomen: No acute findings.

Musculoskeletal: No suspicious bone lesions identified.

Review of the MIP images confirms the above findings.
IMPRESSION: No evidence of pulmonary embolism.

Diffuse bilateral ground-glass pulmonary opacity with multifocal
areas of airspace opacity, consistent with atypical infectious or
inflammatory process.

## 2022-06-24 DIAGNOSIS — L918 Other hypertrophic disorders of the skin: Secondary | ICD-10-CM | POA: Diagnosis not present

## 2022-06-24 DIAGNOSIS — L720 Epidermal cyst: Secondary | ICD-10-CM | POA: Diagnosis not present

## 2022-06-24 DIAGNOSIS — Z85828 Personal history of other malignant neoplasm of skin: Secondary | ICD-10-CM | POA: Diagnosis not present

## 2022-06-24 DIAGNOSIS — L820 Inflamed seborrheic keratosis: Secondary | ICD-10-CM | POA: Diagnosis not present

## 2022-06-24 DIAGNOSIS — L821 Other seborrheic keratosis: Secondary | ICD-10-CM | POA: Diagnosis not present

## 2024-06-07 ENCOUNTER — Other Ambulatory Visit: Payer: Self-pay | Admitting: Orthopedic Surgery

## 2024-06-07 DIAGNOSIS — M25562 Pain in left knee: Secondary | ICD-10-CM

## 2024-07-13 ENCOUNTER — Other Ambulatory Visit
